# Patient Record
Sex: Female | Born: 1965 | Race: White | Hispanic: No | Smoking: Current every day smoker
Health system: Southern US, Community
[De-identification: ages and names within clinical notes are randomized; demographics above are authoritative.]

## PROBLEM LIST (undated history)

## (undated) DIAGNOSIS — F32A Depression, unspecified: Secondary | ICD-10-CM

## (undated) DIAGNOSIS — T7840XA Allergy, unspecified, initial encounter: Secondary | ICD-10-CM

## (undated) DIAGNOSIS — M1990 Unspecified osteoarthritis, unspecified site: Secondary | ICD-10-CM

## (undated) DIAGNOSIS — F419 Anxiety disorder, unspecified: Secondary | ICD-10-CM

## (undated) DIAGNOSIS — F329 Major depressive disorder, single episode, unspecified: Secondary | ICD-10-CM

## (undated) HISTORY — DX: Allergy, unspecified, initial encounter: T78.40XA

## (undated) HISTORY — PX: CARPAL TUNNEL RELEASE: SHX101

---

## 1997-09-25 ENCOUNTER — Ambulatory Visit (HOSPITAL_COMMUNITY): Admission: RE | Admit: 1997-09-25 | Discharge: 1997-09-25 | Payer: Self-pay | Admitting: Obstetrics and Gynecology

## 1997-10-17 ENCOUNTER — Other Ambulatory Visit: Admission: RE | Admit: 1997-10-17 | Discharge: 1997-10-17 | Payer: Self-pay | Admitting: Obstetrics and Gynecology

## 1999-06-09 ENCOUNTER — Other Ambulatory Visit: Admission: RE | Admit: 1999-06-09 | Discharge: 1999-06-09 | Payer: Self-pay | Admitting: Obstetrics and Gynecology

## 2000-06-05 ENCOUNTER — Encounter: Admission: RE | Admit: 2000-06-05 | Discharge: 2000-06-05 | Payer: Self-pay | Admitting: *Deleted

## 2000-07-05 ENCOUNTER — Other Ambulatory Visit: Admission: RE | Admit: 2000-07-05 | Discharge: 2000-07-05 | Payer: Self-pay | Admitting: Obstetrics and Gynecology

## 2000-10-12 ENCOUNTER — Encounter (INDEPENDENT_AMBULATORY_CARE_PROVIDER_SITE_OTHER): Payer: Self-pay | Admitting: Specialist

## 2000-10-12 ENCOUNTER — Ambulatory Visit (HOSPITAL_COMMUNITY): Admission: RE | Admit: 2000-10-12 | Discharge: 2000-10-12 | Payer: Self-pay | Admitting: Obstetrics and Gynecology

## 2003-08-18 ENCOUNTER — Ambulatory Visit (HOSPITAL_COMMUNITY): Admission: RE | Admit: 2003-08-18 | Discharge: 2003-08-18 | Payer: Self-pay | Admitting: Podiatry

## 2004-01-09 ENCOUNTER — Other Ambulatory Visit: Admission: RE | Admit: 2004-01-09 | Discharge: 2004-01-09 | Payer: Self-pay | Admitting: Obstetrics and Gynecology

## 2004-01-13 ENCOUNTER — Encounter: Admission: RE | Admit: 2004-01-13 | Discharge: 2004-01-13 | Payer: Self-pay | Admitting: Obstetrics and Gynecology

## 2004-04-10 ENCOUNTER — Emergency Department (HOSPITAL_COMMUNITY): Admission: EM | Admit: 2004-04-10 | Discharge: 2004-04-10 | Payer: Self-pay | Admitting: Family Medicine

## 2005-02-03 ENCOUNTER — Other Ambulatory Visit: Admission: RE | Admit: 2005-02-03 | Discharge: 2005-02-03 | Payer: Self-pay | Admitting: Obstetrics and Gynecology

## 2006-01-24 ENCOUNTER — Emergency Department: Payer: Self-pay | Admitting: Emergency Medicine

## 2007-10-04 ENCOUNTER — Ambulatory Visit: Payer: Self-pay | Admitting: Unknown Physician Specialty

## 2008-06-18 ENCOUNTER — Ambulatory Visit (HOSPITAL_BASED_OUTPATIENT_CLINIC_OR_DEPARTMENT_OTHER): Admission: RE | Admit: 2008-06-18 | Discharge: 2008-06-18 | Payer: Self-pay | Admitting: Urology

## 2008-06-18 ENCOUNTER — Encounter (INDEPENDENT_AMBULATORY_CARE_PROVIDER_SITE_OTHER): Payer: Self-pay | Admitting: Urology

## 2008-12-23 ENCOUNTER — Emergency Department (HOSPITAL_COMMUNITY): Admission: EM | Admit: 2008-12-23 | Discharge: 2008-12-23 | Payer: Self-pay | Admitting: Emergency Medicine

## 2008-12-28 ENCOUNTER — Emergency Department (HOSPITAL_COMMUNITY): Admission: EM | Admit: 2008-12-28 | Discharge: 2008-12-28 | Payer: Self-pay | Admitting: Family Medicine

## 2009-04-27 ENCOUNTER — Ambulatory Visit (HOSPITAL_COMMUNITY): Admission: RE | Admit: 2009-04-27 | Discharge: 2009-04-27 | Payer: Self-pay | Admitting: Orthopedic Surgery

## 2009-05-21 ENCOUNTER — Ambulatory Visit (HOSPITAL_BASED_OUTPATIENT_CLINIC_OR_DEPARTMENT_OTHER): Admission: RE | Admit: 2009-05-21 | Discharge: 2009-05-21 | Payer: Self-pay | Admitting: Orthopedic Surgery

## 2009-11-30 ENCOUNTER — Ambulatory Visit: Payer: Self-pay | Admitting: Unknown Physician Specialty

## 2010-05-11 LAB — POCT HEMOGLOBIN-HEMACUE: Hemoglobin: 14.5 g/dL (ref 12.0–15.0)

## 2010-06-15 NOTE — Op Note (Signed)
Shirley Grant, Shirley Grant              ACCOUNT NO.:  [id number]   MEDICAL RECORD NO.:  [id number]          PATIENT TYPE:  AMB   LOCATION:  [facility]                         FACILITY:  [facility]   PHYSICIAN:  Maretta Bees. Vonita Moss, M.D.DATE OF BIRTH:  Dec 22, 1965   DATE OF PROCEDURE:  DATE OF DISCHARGE:                               OPERATIVE REPORT   PREOPERATIVE DIAGNOSIS:  Interstitial cystitis.   POSTOPERATIVE DIAGNOSIS:  Interstitial cystitis.   PROCEDURE:  Cystoscopy, hydraulic overdistention of bladder and cold cup  bladder biopsy.   SURGEON:  Maretta Bees. Vonita Moss, M.D.   ANESTHESIA:  General.   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  She did not respond very well to  VESIcare.  Her pelvic pain and symptom score was 19.  She was advised  about possibility of interstitial cystitis and the workup of the risk of  bladder pain, hemorrhage, her small risk of bladder tear from the  procedure.   PROCEDURE:  The patient was brought to the operating room and placed in  lithotomy position.  The external genitalia were prepped and draped in  the usual fashion.  She was cystoscoped and bladder was unremarkable.  She was filled to 800 mL of water with 36 inches of water pressure.  Looking back in she did have scattered submucosal petechiae and a couple  of areas of hemorrhage.  The changes were not florid, but consistent  with interstitial cystitis.  Cold cup bladder biopsies were taken across  the bladder base and dome in three areas that were hemorrhagic.  The  biopsy sites were fulgurated with the Bugbee electrode.  The bladder was  emptied and the scope removed.  The patient was sent to the recovery  room in good condition.  Blood loss was insignificant.  She was given a  B and O suppository.      Maretta Bees. Vonita Moss, M.D.  Electronically Signed     LJP/MEDQ  D:  06/18/2008  T:  06/18/2008  Job:  [id number]

## 2010-06-18 NOTE — Op Note (Signed)
[facility]  Patient:    Shirley Grant, Shirley Grant Visit Number: [id number] MRN: [id number]          Service Type: Attending:  Rande Brunt. Eda Paschal, M.D. Dictated by:   Rande Brunt. Eda Paschal, M.D. Proc. Date: 10/12/00                             Operative Report  Dictation was done on the day of surgery but cannot be located and, therefore, is being redictated.    PREOPERATIVE DIAGNOSIS:       Dysfunctional uterine bleeding with probable endometrial polyp.  POSTOPERATIVE DIAGNOSIS:      Dysfunctional uterine bleeding with endometrial polyp.  OPERATION:                    Hysteroscopy, dilatation and curettage.  SURGEON:                      Daniel L. Eda Paschal, M.D.  ANESTHESIA:                   General.  INDICATIONS:                  The patient is a 45 year old, gravida 4, para 2, AB 2, with persistent menometrorrhagia.  She has been treated for several months with oral contraceptives without resolution of the above.  On ultrasound she has a question of an intrauterine lesion and she now enters the hospital for [facility], hysteroscopy because of persistent dysfunctional uterine bleeding, nonresponsive to oral contraceptives.  FINDINGS:                     External and vaginal within normal limits. Cervix is clean.  Uterus is anteverted, normal and size and shape.  Adnexa are palpably normal.  At the time of hysteroscopy, coming off the posterior wall of the uterus near the lower uterine segment is an approximately 1 cm growth consistent with a small endometrial polyp.  Other than this, top of the fundus, tubal ostia, anterior and posterior walls of the fundus, lower uterine segment and then the cervical canal were free of any disease.  DESCRIPTION OF PROCEDURE:     After adequate general anesthesia, the patient was placed in the dorsal lithotomy position, prepped and draped in the usual sterile manner.  A single-tooth tenaculum was placed in the  anterior lip of the cervix.  The cervix was dilated to a #35 Pratt dilator and then hysteroscopic examination was done using the hysteroscopic resectoscope using 3% sorbitol to expand the intrauterine cavity and using the camera for magnification.  The above findings were noted and 90-degree wire loupe with settings at 70 coag, 110 cutting, blend 1 was utilized and the polyp was excised.  A sharp curettage was then performed to be sure that adequate endometrial sampling was done.  The patient was rehysteroscoped after this and there were no further intrauterine lesions present.  Tissue was sent to pathology for tissue diagnosis.  The patient tolerated the procedure well. Estimated blood loss was less than 50 cc with none replaced.  Fluid deficit was less than 50 cc as well.  The patient left the operating room in satisfactory condition.  DESCRIPTION OF PROCEDURE: Dictated by:   Rande Brunt. Eda Paschal, M.D. Attending:  Rande Brunt. Eda Paschal, M.D. DD:  10/26/00 TD:  10/26/00 Job: [id number]

## 2010-08-29 ENCOUNTER — Inpatient Hospital Stay (INDEPENDENT_AMBULATORY_CARE_PROVIDER_SITE_OTHER)
Admission: RE | Admit: 2010-08-29 | Discharge: 2010-08-29 | Disposition: A | Discharge status: Home / Self Care | Payer: 59 | Source: Ambulatory Visit | Attending: Family Medicine | Admitting: Family Medicine

## 2010-08-29 DIAGNOSIS — M799 Soft tissue disorder, unspecified: Secondary | ICD-10-CM

## 2010-09-08 ENCOUNTER — Other Ambulatory Visit (HOSPITAL_COMMUNITY): Payer: Self-pay | Admitting: Orthopedic Surgery

## 2010-09-08 DIAGNOSIS — M545 Low back pain: Secondary | ICD-10-CM

## 2010-09-15 ENCOUNTER — Encounter (HOSPITAL_COMMUNITY)
Admission: RE | Admit: 2010-09-15 | Discharge: 2010-09-15 | Disposition: A | Discharge status: Home / Self Care | Payer: 59 | Source: Ambulatory Visit | Attending: Orthopedic Surgery | Admitting: Orthopedic Surgery

## 2010-09-15 ENCOUNTER — Ambulatory Visit (HOSPITAL_COMMUNITY)
Admission: RE | Admit: 2010-09-15 | Discharge: 2010-09-15 | Disposition: A | Discharge status: Home / Self Care | Payer: 59 | Source: Ambulatory Visit | Attending: Orthopedic Surgery | Admitting: Orthopedic Surgery

## 2010-09-15 ENCOUNTER — Encounter (HOSPITAL_COMMUNITY): Payer: Self-pay

## 2010-09-15 DIAGNOSIS — M545 Low back pain, unspecified: Secondary | ICD-10-CM | POA: Insufficient documentation

## 2010-09-15 DIAGNOSIS — Y9319 Activity, other involving water and watercraft: Secondary | ICD-10-CM | POA: Insufficient documentation

## 2010-09-15 DIAGNOSIS — IMO0002 Reserved for concepts with insufficient information to code with codable children: Secondary | ICD-10-CM | POA: Insufficient documentation

## 2010-09-15 DIAGNOSIS — W1692XA Jumping or diving into unspecified water causing other injury, initial encounter: Secondary | ICD-10-CM | POA: Insufficient documentation

## 2010-09-15 MED ORDER — TECHNETIUM TC 99M MEDRONATE IV KIT
24.2000 | PACK | Freq: Once | INTRAVENOUS | Status: AC | PRN
  Administered 2010-09-15: 24.2 via INTRAVENOUS

## 2011-10-07 LAB — COMPREHENSIVE METABOLIC PANEL
Alkaline Phosphatase: 98 U/L (ref 50–136)
Bilirubin,Total: 0.1 mg/dL — ABNORMAL LOW (ref 0.2–1.0)
Calcium, Total: 8.2 mg/dL — ABNORMAL LOW (ref 8.5–10.1)
Chloride: 109 mmol/L — ABNORMAL HIGH (ref 98–107)
Co2: 22 mmol/L (ref 21–32)
EGFR (African American): >60
EGFR (Non-African Amer.): >60
Osmolality: 280 (ref 275–301)
Potassium: 3.5 mmol/L (ref 3.5–5.1)
Sodium: 141 mmol/L (ref 136–145)
Total Protein: 6.6 g/dL (ref 6.4–8.2)

## 2011-10-07 LAB — ETHANOL
Ethanol %: 0.246 % — ABNORMAL HIGH (ref 0.000–0.080)
Ethanol: 246 mg/dL

## 2011-10-07 LAB — CBC
HCT: 40.4 % (ref 35.0–47.0)
HGB: 14.1 g/dL (ref 12.0–16.0)
MCH: 33.2 pg (ref 26.0–34.0)
MCHC: 34.9 g/dL (ref 32.0–36.0)
MCV: 95 fL (ref 80–100)
Platelet: 268 10*3/uL (ref 150–440)
RDW: 13.3 % (ref 11.5–14.5)

## 2011-10-07 LAB — TSH: Thyroid Stimulating Horm: 1.77 u[IU]/mL

## 2011-10-08 ENCOUNTER — Inpatient Hospital Stay: Payer: Self-pay | Admitting: Psychiatry

## 2011-10-08 LAB — URINALYSIS, COMPLETE
Leukocyte Esterase: NEGATIVE
Ph: 6 (ref 4.5–8.0)
Protein: NEGATIVE
RBC,UR: NONE SEEN /HPF (ref 0–5)

## 2011-10-08 LAB — DRUG SCREEN, URINE
Amphetamines, Ur Screen: NEGATIVE (ref ?–1000)
Benzodiazepine, Ur Scrn: POSITIVE (ref ?–200)
MDMA (Ecstasy)Ur Screen: NEGATIVE (ref ?–500)
Opiate, Ur Screen: NEGATIVE (ref ?–300)
Phencyclidine (PCP) Ur S: NEGATIVE (ref ?–25)

## 2011-10-08 LAB — PREGNANCY, URINE: Pregnancy Test, Urine: NEGATIVE m[IU]/mL

## 2011-10-08 LAB — ACETAMINOPHEN LEVEL: Acetaminophen: <2 ug/mL

## 2011-10-10 LAB — FOLATE: Folic Acid: 19.6 ng/mL (ref 3.1–100.0)

## 2012-06-10 ENCOUNTER — Emergency Department: Payer: Self-pay | Admitting: Emergency Medicine

## 2013-06-04 ENCOUNTER — Emergency Department: Payer: Self-pay | Admitting: Emergency Medicine

## 2013-06-05 LAB — URINALYSIS, COMPLETE
Bilirubin,UR: NEGATIVE
GLUCOSE, UR: NEGATIVE mg/dL (ref 0–75)
KETONE: NEGATIVE
LEUKOCYTE ESTERASE: NEGATIVE
NITRITE: NEGATIVE
PH: 5 (ref 4.5–8.0)
PROTEIN: NEGATIVE
RBC,UR: <1 /HPF (ref 0–5)
SPECIFIC GRAVITY: 1.005 (ref 1.003–1.030)

## 2013-06-05 LAB — ETHANOL
Ethanol %: 0.096 % — ABNORMAL HIGH
Ethanol: 96 mg/dL

## 2013-06-05 LAB — DRUG SCREEN, URINE
AMPHETAMINES, UR SCREEN: NEGATIVE (ref ?–1000)
Barbiturates, Ur Screen: NEGATIVE (ref ?–200)
Benzodiazepine, Ur Scrn: POSITIVE (ref ?–200)
Cannabinoid 50 Ng, Ur ~~LOC~~: NEGATIVE (ref ?–50)
Cocaine Metabolite,Ur ~~LOC~~: NEGATIVE (ref ?–300)
MDMA (Ecstasy)Ur Screen: NEGATIVE (ref ?–500)
METHADONE, UR SCREEN: NEGATIVE (ref ?–300)
Opiate, Ur Screen: POSITIVE (ref ?–300)
PHENCYCLIDINE (PCP) UR S: NEGATIVE (ref ?–25)
Tricyclic, Ur Screen: NEGATIVE (ref ?–1000)

## 2013-06-05 LAB — COMPREHENSIVE METABOLIC PANEL WITH GFR
Albumin: 3.5 g/dL
Alkaline Phosphatase: 49 U/L
Anion Gap: 6 — ABNORMAL LOW
BUN: 9 mg/dL
Bilirubin,Total: 0.2 mg/dL
Calcium, Total: 8.6 mg/dL
Chloride: 104 mmol/L
Co2: 30 mmol/L
Creatinine: 0.82 mg/dL
EGFR (African American): >60
EGFR (Non-African Amer.): >60
Glucose: 92 mg/dL
Osmolality: 278
Potassium: 3.6 mmol/L
SGOT(AST): 19 U/L
SGPT (ALT): 16 U/L
Sodium: 140 mmol/L
Total Protein: 6.1 g/dL — ABNORMAL LOW

## 2013-06-05 LAB — CBC
HCT: 38.6 %
HGB: 13.2 g/dL
MCH: 31.8 pg
MCHC: 34.2 g/dL
MCV: 93 fL
Platelet: 226 x10 3/mm 3
RBC: 4.16 X10 6/mm 3
RDW: 13.7 %
WBC: 10.1 x10 3/mm 3

## 2013-06-05 LAB — ACETAMINOPHEN LEVEL: Acetaminophen: 7 ug/mL — ABNORMAL LOW

## 2013-06-05 LAB — PREGNANCY, URINE: Pregnancy Test, Urine: NEGATIVE m[IU]/mL

## 2013-06-05 LAB — SALICYLATE LEVEL: Salicylates, Serum: 2.6 mg/dL

## 2014-05-20 NOTE — Consult Note (Signed)
Psychological Assessment  Shirley Grant Evaluation: 9-12-13Administered: [facility] Multiphasic Personality Inventory-2 (MMPI-2) for Referral: Ms. Shirley Grant was referred for a psychological assessment by her physician, Caryn SectionAarti Kapur, MD.  [facility] after overdosing on xanax and alcohol. She also reports abuse of alcohol on a daily basis. Please see the history and physical and psychosocial history for further background information. An assessment of personality structure was requested. Shirley Grant?s MMPI-2 protocol is compared to that of other adult females she obtained the following profile: [id number]. The MMPI-2 validity scales indicate that the clinical profile is not valid. The significant elevation on the positive malingering scales suggests a conscious attempt to present herself in a positive light and minimize any difficulties she may be experiencing. Impression:Good" profile   Electronic Signatures: Shirley Grant, Shirley Grant (PsyD, HSP-P)  (Signed on 12-Sep-13 15:17)  Authored  Last Updated: 12-Sep-13 15:17 by Shirley Frostoush, Shirley Grant (PsyD, HSP-P)

## 2014-05-20 NOTE — H&P (Signed)
PATIENT NAME:  Shirley GuarneriOAH, Shirley Grant MR#:  [id number] DATE OF BIRTH:  1965-03-25  DATE OF ADMISSION:  10/08/2011  IDENTIFYING INFORMATION: The patient is a 49 year old white female not employed since she lost her job a few days ago at [employer] after having worked two weeks and she lost her job because of a DWI. The patient has been married for eight years for the second time. She lives with her husband. They have two children that are 5516 and 49 years old. The patient comes for her first inpatient hospitalization on Psychiatry at [facility] with chief complaint "I overdosed on my Xanax and I took in too much alcohol and I drink every day and I need help".   HISTORY OF PRESENT ILLNESS: The patient reports she drinks beer at a rate of 8 to 12 cans per day and on top of it she overdosed on Xanax which are prescription pills and she does not know how many she took and so she was brought here for help. According to information obtained from the chart, she is on IVC that states that she has history of depression and she overdosed on Xanax this evening. The patient agrees to the statement and she reports that she has been depressed for many years. She has been getting her Xanax from Dr. Excell SeltzerBaker and she gets Xanax 1 mg p.o. three times a day and she has been getting this medication since age 49 years.   PAST PSYCHIATRIC HISTORY: No history of inpatient hospitalization on Psychiatry. No history of suicide attempts. She is being followed by Dr. Excell SeltzerBaker.   FAMILY HISTORY OF MENTAL ILLNESS: None known for mental illness. No history of suicides in the family.   FAMILY HISTORY: Raised by parents. Father is an Art gallery managerengineer. Father is living and 49 years old. Mother worked for [employer]. Mother is retired. She is living and 49 years old. Has three siblings. Close to family.   PERSONAL HISTORY: Born [place] Graduated from high school. Has an RN degree.   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 This job ended as they sold the business. 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   MILITARY HISTORY: None.  MARRIAGES: Married twice. First marriage lasted for four years. Cause of divorce "can't get along.". Has two children from first marriage ages 4716 and 49 years old. Second marriage is the current one and has been married for eight years. He owns a golf course and she calls him a "mean man". The patient has been having lots of conflicts with the 49 year old.   ALCOHOL AND DRUGS: First drink of alcohol was at age 89 years, became a problem during her second marriage because of problems with marriage. Admits that currently she has been drinking at a rate of 8 to 12 beers a day until she is drunk. Had one DWI when she lost her job. Never arrested for public drunkenness. Does admit to blackouts and tremors but no seizures and no DTs. Denies any other street or prescription drug abuse. Denies using IV drugs. Does admit smoking nicotine cigarettes at a rate of 1 to 2 packs a day for many years.   MEDICAL HISTORY: No known high blood pressure. No known diabetes. No major surgeries. No major injuries. No history of motor vehicle accident. Never been unconscious.   ALLERGIES: Allergic to  codeine.  PRIMARY CARE PHYSICIAN: She is being followed by Dr. Vear Clock at [facility]. Last appointment was sometime ago, cannot remember. Next appointment is to be made.   PHYSICAL EXAMINATION:  VITAL SIGNS: Temperature 97.2, pulse 71 per minute and regular, respirations 20 per minute and regular, blood pressure 130/80 mmHg.   HEENT: Head is normocephalic and atraumatic. Pupils equal, round, and reactive to light and accommodation. Fundi bilaterally benign. EOMS visualized. Tympanic membranes no exudate.   NECK: Supple without any organomegaly, lymphadenopathy, or  thyromegaly.  CHEST: Normal expansion. Normal breath sounds heard.   HEART: Normal S1, S2 without any murmurs or gallops.  ABDOMEN: Soft. No organomegaly. Bowel sounds heard.   RECTAL/PELVIC: Deferred.   SKIN: Normal turgor. No rash. Warm and dry. Intact.   EXTREMITIES: Nontender. Normal range of motion. No signs of injury. No pedal edema. No cyanosis. No clubbing.   NEUROLOGIC: Gait is normal. Romberg is negative. Cranial nerves II through XII are intact. Deep tendon reflexes 2+. Plantars are normal response.   MENTAL STATUS EXAMINATION: According to information obtained from the chart, the patient had slurred speech at the time of admission but currently speech is clear though it is slow. The patient appears to be drowsy, probably coming out of alcohol and from Xanax. She knew date, time, place, person, season, and situation. She knew the capital of [address], capital of [place], the name of the current president. Does admit feeling depressed. Denies feeling hopeless or helpless. Denies feeling worthless or useless. Denies suicidal or homicidal ideas or plans and said "not now". Probably she did have suicidal thoughts when she tried to overdose herself on an unknown number of Xanax pills. No evidence of psychosis. Denies auditory or visual hallucinations. Denies hearing voices or seeing things. Denies paranoid or suspicious ideas. Denies thought insertion or thought control. Denies having any grandiose ideas. She could spell the word world forward and backward though she was slow in answering. She could count money and recall and memory are good. Abstraction interpretations are good. Does admit to sleep and appetite disturbance since she has been feeling depressed and having marital conflicts. Insight and judgment guarded.  IMPRESSION: AXIS I: 1. Alcohol dependence, chronic, continuous with intoxication. 2. Prescription drug abuse with Xanax abuse and overdose on the  same. 3. Nicotine dependence.  4. Conflicts with family members, that is husband and with son.   AXIS II: Deferred.   AXIS III: None major.  AXIS IV: Severe. 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  AXIS V: GAF 25.   PLAN: The patient is admitted to [facility] for close observation, evaluation, and help. She will be started on detox protocol and will be started on medications to slowly withdraw her from benzodiazepines. She will be given antidepressant medications and medications to help her rest. During the stay in the hospital she will be given milieu therapy and supportive counseling and substance abuse counseling will be added. At the time of discharge she will not be depressed, will have enough information about substance abuse and problems related to the same. Appropriate follow-up appointments will be made and substance abuse referrals will be made at the time of discharge.   ____________________________ Jannet Mantis. Guss Bunde, MD skc:drc D: 10/08/2011 18:56:12 ET T: 10/09/2011 07:43:46 ET JOB#: [id number]  cc: Monika Salk K. Guss Bunde, MD, <Dictator> Beau Fanny MD ELECTRONICALLY SIGNED 10/09/2011 17:14

## 2014-05-20 NOTE — Consult Note (Signed)
  Consult Patient seen as requested of Dr. Maryruth BunKapur, MD in order to explore CD-IOP admission at this discharge from this Beh Med inpatient admission. Patient admitted to abuse of Alcoholic beverage as well as abuse of Rx meds. She was able to detail events that required the need for treatment in the Behavioral Medicine unit and was able to give evidence of the need for CD-IOP. She reported loss of employment due to recent DWI, Martial distress with pending separation, and the need to develop coping skills. She indicated that since she was no longer employed she would be unable to participate in the CD-IOP due to her limited finances.  She was oriented to the CD-IOP. Social Worker informed of outcome of assessment.   Electronic Signatures: Huel Cotehomas, Richard (PsyD)  (Signed on 09-Sep-13 15:03)  Authored  Last Updated: 09-Sep-13 15:03 by Huel Cotehomas, Richard (PsyD)

## 2014-05-24 NOTE — Consult Note (Signed)
PATIENT NAME:  Shirley GuarneriOAH, Conleigh MR#:  [id number] DATE OF BIRTH:  1965/09/11  DATE OF CONSULTATION:  06/05/2013  REFERRING PHYSICIAN:   CONSULTING PHYSICIAN:  Audery AmelJohn T. Clapacs, MD  IDENTIFYING INFORMATION AND REASON FOR CONSULT: A 49 year old woman with a history of alcohol dependence presented to the hospital with a possible suicide attempt. Consultation for appropriate disposition.   HISTORY OF PRESENT ILLNESS: Information obtained from the patient and the chart. The patient was brought to the Emergency Room after she texted a friend that she was taking pills with the implication that she was trying to kill herself. The friend called 911. The patient admits to me today that she took several Xanax and several Voltaren along with drinking. She claims now that she had no suicidal intent at all and that the person misunderstood her. She said that her mood has been feeling down, but that is chronic for her. She denies any suicidal ideation.  She says that she drinks alcohol and binges on it about once a week.  She is ambivalent about whether it is a problem. She is prescribed Xanax and claims that she does not abuse it. After pressing her, I got her to admit that she takes narcotic pain medicines as well as having occasional prescriptions of Vicodin. The patient says that she sleeps fine but that is because she takes her sedating medicine at night. Appetite is fine. Mood a little bit down. Denies psychotic symptoms. I spoke to her mother on the phone who describes the patient as being an alcoholic who is chronically out of control. Mother did not report any concern about suicidal behavior but did report long-standing problems with substance abuse.   PAST PSYCHIATRIC HISTORY: The patient has been to our hospital before. It has been a couple of years ago. At that time, she was drinking more heavily and went through detoxification. She was sent to the alcohol and drug abuse treatment center under involuntary  commitment. When I mentioned the experience to her today, she reacted with horror and begged me not to send her to the alcohol and drug abuse treatment center anymore. She has not been to any other rehabilitation since then. She denies that she is taking any medication, and currently for treatment of depression said that she was taking Lexapro but that it does not work anymore. She thinks that she might have taken medicine in the past that helped, but then says that she cannot remember what it was. Denies suicide attempts.   SUBSTANCE ABUSE HISTORY: Mother says that the patient has had a problem with drinking since she was a teenager. She has been to some rehabilitation programs, but has largely resisted facing up to it or getting sober. Also seems to abuse pills at times.   FAMILY HISTORY:  None known.  SOCIAL HISTORY: The patient is separated. Has an adult daughter. The patient says she is working part-time as a Engineer, civil (consulting)nurse currently. She sometimes lives with her mother and father, sometimes with her adult daughter. Seems to have difficulty getting along with people.   PAST MEDICAL HISTORY: Says she has chronic pain from bursitis in her right hip which is why she takes the Voltaren and Vicodin.   CURRENT MEDICATIONS: Xanax 1 mg 3 times a day, possibly Lexapro 20 mg a day when she takes it.   ALLERGIES: No known drug allergies.   REVIEW OF SYSTEMS: Pain in the right hip moderate. Fatigue. Anxiety. Denies suicidal ideation. Denies homicidal ideation. Denies any psychotic symptoms. The rest  of the review of systems is negative.   MENTAL STATUS EXAMINATION:  Disheveled woman, looks her stated age or older. Passively cooperative, somewhat defensive. Eye contact good. Psychomotor activity, a little fidgety. Speech normal rate, tone and volume. Affect anxious. Mood stated as being worried. Thoughts are lucid. No evidence of loosening of associations. No evidence of delusions. Denies auditory or visual  hallucinations. Denies suicidal or homicidal ideation. Judgment and insight somewhat impaired. Intelligence normal. Alert and oriented x 4, short and long-term memory intact.   LABORATORY RESULTS: Urinalysis has bacteria in it, possible infection. Pregnancy test negative. Drug test positive for benzodiazepines, also for opiates. Alcohol level was 96 on admission. Chemistry normal. CBC normal.   ASSESSMENT: A 49 year old woman with a history of alcohol dependence presents having made suicidal statements when taking pills and drinking. Sobered up now, she absolutely denies any suicidal ideation. The patient is requesting discharge, minimizing her symptoms. She does not appear to be having serious withdrawal problems. She does appear to have poor insight and judgment about her long-term treatment.   TREATMENT PLAN: Given the time of night and the lack of resources, I am not going to discharge her at the moment. We will continue to observe her overnight in the Emergency Room. I offered the alcohol and drug abuse treatment center to her but she absolutely reacted with horror. We will rediscuss her substance abuse in the morning. If she continues to be physically stable and denying suicidal ideation, we will probably discharge her with outpatient referral in the community.   DIAGNOSIS, PRINCIPAL AND PRIMARY:  AXIS I:  Substance-induced mood disorder.   SECONDARY DIAGNOSES:  AXIS I:  1. Alcohol dependence.  2. Benzodiazepine abuse.  AXIS II: Deferred.  AXIS III: Chronic pain.  AXIS IV: Severe from effects of her substance abuse on her lifestyle.  AXIS V: Functioning at time of evaluation 40.   ____________________________ Audery Amel, MD jtc:dd D: 06/05/2013 22:07:41 ET T: 06/06/2013 00:55:15 ET JOB#: [id number]  cc: Audery Amel, MD, <Dictator> Audery Amel MD ELECTRONICALLY SIGNED 06/10/2013 0:42

## 2014-05-24 NOTE — Consult Note (Signed)
PATIENT NAME:  Shirley Grant, Shirley Grant MR#:  [id number] DATE OF BIRTH:  1965-09-02  DATE OF CONSULTATION:  06/06/2013  REFERRING PHYSICIAN:   CONSULTING PHYSICIAN:  Audery AmelJohn T. Clapacs, MD  HISTORY OF PRESENT ILLNESS: This is a 49 year old woman who presented to the Emergency Room after being brought in under involuntary commitment. She had been drinking and took some of her prescription medicine and allegedly had texted a friend statements that alleged suicidal ideation. Since being here in the hospital, she initially made some vaguely suicidal comments to nursing, but has consistently denied any suicidal ideation to me. She has sobered up from her intoxication and is not showing any acute signs of withdrawal. The patient does not appear to be psychotic. She is able to state positive plans for the future. On further evaluation she acknowledges that she has had a drinking problem for a long time and says that she plans to stop drinking. She was open to accepting information about [facility] and the intensive outpatient program.   REVIEW OF SYSTEMS: Denies suicidal or homicidal ideation. Says mood is improved. Not feeling sick to her stomach, not nauseous, not having any agitation. The rest of the review of systems is negative.   MENTAL STATUS EXAMINATION: Slightly disheveled woman who looks her stated age, cooperative with the interview. Eye contact good. Psychomotor activity, normal speech, normal rate, tone and volume. Affect slightly blunted but reactive. Mood stated as being okay. Thoughts are lucid and directed without loosening of associations or delusions. Denies auditory or visual hallucinations. Denies suicidal or homicidal ideation. Judgment and insight improving. Normal intelligence. Alert and oriented x4, short and long-term memory appear generally intact.   ASSESSMENT: A 49 year old woman with alcohol dependence confirmed by my independent contact with her mother. Was drinking and took some prescription pills  in an nonfatal overdose, but with possible suicidal statements. Now completely denies suicidal ideation and has positive plans for the future. Able to articulate an understanding that she ought to stop drinking and get into substance abuse treatment. The patient has declined our offer for referral to inpatient rehab. She has been educated about [facility] and their programs including the intensive outpatient program and states that she intends to go and follow up with them immediately. At this point, she no longer meets commitment criteria. I have discussed the case with the Emergency Room doctor and she can be released from the Emergency Room with [facility].   DIAGNOSIS, PRINCIPAL AND PRIMARY:  AXIS I: Substance-induced mood disorder, resolved.   SECONDARY DIAGNOSES: AXIS I:  1.  Alcohol dependence.  2.  Benzodiazepine abuse and opiate abuse.  AXIS II: Deferred.  AXIS III: No diagnosis.  AXIS IV: Moderate to severe from having an unstable place to live, ongoing substance abuse.  AXIS V: Functioning at time of discharge 55.  ____________________________ Audery AmelJohn T. Clapacs, MD jtc:sb D: 06/06/2013 12:39:24 ET T: 06/06/2013 12:53:31 ET JOB#: [id number]  cc: Audery AmelJohn T. Clapacs, MD, <Dictator> Audery AmelJOHN T CLAPACS MD ELECTRONICALLY SIGNED 06/10/2013 0:42

## 2014-07-25 ENCOUNTER — Encounter: Payer: Self-pay | Admitting: *Deleted

## 2014-07-25 ENCOUNTER — Emergency Department
Admission: EM | Admit: 2014-07-25 | Discharge: 2014-07-25 | Disposition: A | Discharge status: Home / Self Care | Payer: No Typology Code available for payment source | Attending: Emergency Medicine | Admitting: Emergency Medicine

## 2014-07-25 ENCOUNTER — Emergency Department: Payer: No Typology Code available for payment source

## 2014-07-25 DIAGNOSIS — N3090 Cystitis, unspecified without hematuria: Secondary | ICD-10-CM | POA: Diagnosis not present

## 2014-07-25 DIAGNOSIS — R109 Unspecified abdominal pain: Secondary | ICD-10-CM

## 2014-07-25 DIAGNOSIS — Z3202 Encounter for pregnancy test, result negative: Secondary | ICD-10-CM | POA: Insufficient documentation

## 2014-07-25 DIAGNOSIS — Z720 Tobacco use: Secondary | ICD-10-CM | POA: Diagnosis not present

## 2014-07-25 HISTORY — DX: Anxiety disorder, unspecified: F41.9

## 2014-07-25 HISTORY — DX: Depression, unspecified: F32.A

## 2014-07-25 HISTORY — DX: Major depressive disorder, single episode, unspecified: F32.9

## 2014-07-25 LAB — COMPREHENSIVE METABOLIC PANEL
ALBUMIN: 3.6 g/dL (ref 3.5–5.0)
ALK PHOS: 60 U/L (ref 38–126)
ALT: 11 U/L — AB (ref 14–54)
ANION GAP: 4 — AB (ref 5–15)
AST: 16 U/L (ref 15–41)
BILIRUBIN TOTAL: 0.2 mg/dL — AB (ref 0.3–1.2)
BUN: 10 mg/dL (ref 6–20)
CHLORIDE: 105 mmol/L (ref 101–111)
CO2: 27 mmol/L (ref 22–32)
Calcium: 8.8 mg/dL — ABNORMAL LOW (ref 8.9–10.3)
Creatinine, Ser: 0.71 mg/dL (ref 0.44–1.00)
GFR calc Af Amer: >60 mL/min (ref >60)
GFR calc non Af Amer: >60 mL/min (ref >60)
Glucose, Bld: 90 mg/dL (ref 65–99)
POTASSIUM: 3.6 mmol/L (ref 3.5–5.1)
SODIUM: 136 mmol/L (ref 135–145)
TOTAL PROTEIN: 6.4 g/dL — AB (ref 6.5–8.1)

## 2014-07-25 LAB — CBC
HEMATOCRIT: 39.9 % (ref 35.0–47.0)
Hemoglobin: 13.2 g/dL (ref 12.0–16.0)
MCH: 31.3 pg (ref 26.0–34.0)
MCHC: 33.1 g/dL (ref 32.0–36.0)
MCV: 94.4 fL (ref 80.0–100.0)
PLATELETS: 202 10*3/uL (ref 150–440)
RBC: 4.23 MIL/uL (ref 3.80–5.20)
RDW: 13.4 % (ref 11.5–14.5)
WBC: 8.1 10*3/uL (ref 3.6–11.0)

## 2014-07-25 LAB — URINALYSIS COMPLETE WITH MICROSCOPIC (ARMC ONLY)
BILIRUBIN URINE: NEGATIVE
GLUCOSE, UA: NEGATIVE mg/dL
KETONES UR: NEGATIVE mg/dL
NITRITE: NEGATIVE
Protein, ur: NEGATIVE mg/dL
SPECIFIC GRAVITY, URINE: 1.006 (ref 1.005–1.030)
pH: 5 (ref 5.0–8.0)

## 2014-07-25 LAB — LIPASE, BLOOD: LIPASE: 29 U/L (ref 22–51)

## 2014-07-25 LAB — PREGNANCY, URINE: Preg Test, Ur: NEGATIVE

## 2014-07-25 MED ORDER — KETOROLAC TROMETHAMINE 30 MG/ML IJ SOLN
INTRAMUSCULAR | Status: AC
  Administered 2014-07-25: 30 mg
  Filled 2014-07-25: qty 1

## 2014-07-25 MED ORDER — SODIUM CHLORIDE 0.9 % IV SOLN
1000.0000 mL | Freq: Once | INTRAVENOUS | Status: AC
  Administered 2014-07-25: 1000 mL via INTRAVENOUS

## 2014-07-25 MED ORDER — NITROFURANTOIN MONOHYD MACRO 100 MG PO CAPS: 100.0000 mg | ORAL_CAPSULE | Freq: Two times a day (BID) | ORAL | Status: AC

## 2014-07-25 NOTE — ED Notes (Signed)
Pt states she was dx with kidney stones by PCP Dr.Phillips on Thurs and has had no pain relief, now has urinary symptoms. Pt c/o left flank and LLQ pain. Pt c/o freq with urination and pressure. Pt was given Percocet 10/325mg  taken last at midnight.

## 2014-07-25 NOTE — ED Notes (Signed)
Patient transported to CT 

## 2014-07-25 NOTE — ED Provider Notes (Signed)
[facility] Emergency Department Provider Note  ____________________________________________  Time seen: 7 AM  I have reviewed the triage vital signs and the nursing notes.   HISTORY  Chief Complaint Flank Pain     HPI Shirley Grant is a 49 y.o. female who presents with complaints of right sided back pain which she attributes to kidney stones. She reports she was diagnosed by her PCP with kidney stones although no images were done. She reports she developed pain in the left and right flanks simultaneously yesterday. The pain is sharp and "excruciating". She reports her physician gave her Percocet 10 mg but those are not doing anything. She denies nausea vomiting. No fevers no chills. She has mild dysuria. No recent injury. No neuro deficits. She denies IV drug abuse.     Past Medical History  Diagnosis Date  . Anxiety   . Depression     There are no active problems to display for this patient.   Past Surgical History  Procedure Laterality Date  . Carpal tunnel release      No current outpatient prescriptions on file.  Allergies Codeine  No family history on file.  Social History History  Substance Use Topics  . Smoking status: Current Every Day Smoker -- 1.00 packs/day for 32 years    Types: Cigarettes  . Smokeless tobacco: Not on file  . Alcohol Use: No    Review of Systems  Constitutional: Negative for fever. Eyes: Negative for visual changes. ENT: Negative for sore throat Cardiovascular: Negative for chest pain. Respiratory: Negative for shortness of breath. Gastrointestinal: Right flank pain Genitourinary: Mild dysuria Musculoskeletal: Right back pain Skin: Negative for rash. Neurological: Negative for headaches or focal weakness Psychiatric: No anxiety  10-point ROS otherwise negative.  ____________________________________________   PHYSICAL EXAM:  VITAL SIGNS: ED Triage Vitals  Enc Vitals Group     BP 07/25/14  0619 156/97 mmHg     Pulse Rate 07/25/14 0619 122     Resp 07/25/14 0619 20     Temp 07/25/14 0619 97.9 F (36.6 C)     Temp Source 07/25/14 0619 Oral     SpO2 07/25/14 0619 97 %     Weight 07/25/14 0619 169 lb (76.658 kg)     Height 07/25/14 0619 5\' 6"  (1.676 m)     Head Cir --      Peak Flow --      Pain Score 07/25/14 0620 8     Pain Loc --      Pain Edu? --      Excl. in GC? --      Constitutional: Alert and oriented. Well appearing and in no distress. Eyes: Conjunctivae are normal.  ENT   Head: Normocephalic and atraumatic.   Mouth/Throat: Mucous membranes are moist. Cardiovascular: Normal rate, regular rhythm. Normal and symmetric distal pulses are present in all extremities. No murmurs, rubs, or gallops. Respiratory: Normal respiratory effort without tachypnea nor retractions. Breath sounds are clear and equal bilaterally.  Gastrointestinal: Soft and non-tender in all quadrants. No distention. There is no CVA tenderness. Genitourinary: deferred Musculoskeletal: Nontender with normal range of motion in all extremities. No lower extremity tenderness nor edema. Neurologic:  Normal speech and language. No gross focal neurologic deficits are appreciated. Skin:  Skin is warm, dry and intact. No rash noted. Psychiatric: Mood and affect are normal. Patient exhibits appropriate insight and judgment.  ____________________________________________    LABS (pertinent positives/negatives)  Labs Reviewed  CBC  COMPREHENSIVE METABOLIC PANEL  URINALYSIS COMPLETEWITH MICROSCOPIC ([facility] ONLY)  LIPASE, BLOOD  PREGNANCY, URINE    ____________________________________________   EKG  None  ____________________________________________    RADIOLOGY  CT scan unremarkable  ____________________________________________   PROCEDURES  Procedure(s) performed: none  Critical Care performed: none  ____________________________________________   INITIAL IMPRESSION /  ASSESSMENT AND PLAN / ED COURSE  Pertinent labs & imaging results that were available during my care of the patient were reviewed by me and considered in my medical decision making (see chart for details).  Review of past medical records demonstrates patient has a history of benzodiazepine and opiate abuse as well as a history of depression which has led to her being involuntarily committed at our hospital before. Given this history and that I am not yet certain as to the cause of the patient's pain I offered her IV Toradol which she refused and stated it did not work for her. We will give her IV fluids check blood tests urinalysis possibly CAT scan.  ----------------------------------------- 9:22 AM on 07/25/2014 -----------------------------------------  CT scan negative. Possibly mild cystitis. We will treat with antibiotics. Patient has pain medications at home. Heart rate improved with fluids. She feels better and is anxious to leave and requests for the IV to be removed  ____________________________________________   FINAL CLINICAL IMPRESSION(S) / ED DIAGNOSES  Final diagnoses:  Flank pain  Cystitis     Jene Every, MD 07/25/14 [id number]

## 2014-07-25 NOTE — Discharge Instructions (Signed)

## 2015-09-01 ENCOUNTER — Encounter: Payer: Self-pay | Admitting: Podiatry

## 2015-09-01 ENCOUNTER — Ambulatory Visit (INDEPENDENT_AMBULATORY_CARE_PROVIDER_SITE_OTHER): Payer: Self-pay | Admitting: Podiatry

## 2015-09-01 ENCOUNTER — Ambulatory Visit (INDEPENDENT_AMBULATORY_CARE_PROVIDER_SITE_OTHER): Payer: Self-pay

## 2015-09-01 VITALS — BP 140/102 | HR 84 | Resp 18

## 2015-09-01 DIAGNOSIS — R52 Pain, unspecified: Secondary | ICD-10-CM

## 2015-09-01 DIAGNOSIS — M722 Plantar fascial fibromatosis: Secondary | ICD-10-CM

## 2015-09-01 NOTE — Patient Instructions (Signed)

## 2015-09-01 NOTE — Progress Notes (Signed)
   Subjective:    Patient ID: KIELA GOOKIN, female    DOB: 11/14/1965, 50 y.o.   MRN: [id number]  HPI  50 year old female presents the office today for concerns of 2 areas of soft tissue mass in the arch of her left foot which is been ongoing for about 6 months. She feels the areas have been growing in the area is painful with pressure and weightbearing. Denies stepping on any foreign objects. Denies any recent injury or trauma. No recent treatment for this. No other concerns today.  Review of Systems  All other systems reviewed and are negative.      Objective:   Physical Exam General: AAO x3, NAD  Dermatological: Skin is warm, dry and supple bilateral. Nails x 10 are well manicured; remaining integument appears unremarkable at this time. There are no open sores, no preulcerative lesions, no rash or signs of infection present.  Vascular: Dorsalis Pedis artery and Posterior Tibial artery pedal pulses are 2/4 bilateral with immedate capillary fill time. Pedal hair growth present.  There is no pain with calf compression, swelling, warmth, erythema.   Neruologic: Grossly intact via light touch bilateral. Vibratory intact via tuning fork bilateral. Protective threshold with Semmes Wienstein monofilament intact to all pedal sites bilateral.   Musculoskeletal: Two non-mobile soft tissue masses present in the medial band of the plantar fascial in the arch from the left side. There is tenderness palpation along this area. There is no overlying erythema or increase in warmth. It is no overlying skin breakdown. There is no other areas of tenderness to bilateral lower chemise.  Gait: Unassisted, Nonantalgic.       Assessment & Plan:  Left foot plantar fibromas 2 -Treatment options discussed including all alternatives, risks, and complications -Etiology of symptoms were discussed -X-rays were obtained and reviewed with the patient. 2 skin markers elected to have an area soft tissue mass. No  underlying bony destruction no evidence of foreign body. -I discussed steroid injection she wishes to proceed. Under sterile conditions mature Kenalog and local anesthetic was infiltrated into each of the soft tissue mass without couple complications. Post injection care was discussed. -Order compound cream to include verapamil -Offloading pads -Follow-up as scheduled or sooner if needed.  Ovid Curd, DPM

## 2015-09-06 DIAGNOSIS — M722 Plantar fascial fibromatosis: Secondary | ICD-10-CM | POA: Insufficient documentation

## 2015-09-22 ENCOUNTER — Encounter: Payer: Self-pay | Admitting: Podiatry

## 2015-09-22 ENCOUNTER — Ambulatory Visit (INDEPENDENT_AMBULATORY_CARE_PROVIDER_SITE_OTHER): Payer: Self-pay | Admitting: Podiatry

## 2015-09-22 DIAGNOSIS — M722 Plantar fascial fibromatosis: Secondary | ICD-10-CM

## 2015-09-22 NOTE — Progress Notes (Signed)
Subjective: 50 year old female presents the office today for Evaluation of plantar fibromatosis on the left foot. She states that the mass has decreased greatly in size and she can walk without pain but there is still the mass there. She did not get the verapamil cream.  Denies any systemic complaints such as fevers, chills, nausea, vomiting. No acute changes since last appointment, and no other complaints at this time.   Objective: AAO x3, NAD DP/PT pulses palpable bilaterally, CRT less than 3 seconds On the medial band of the plantar fascia is a non-mobile firm soft tissue mass which is sustained slight decrease in size in last appointment. Mild, but much improved, compared to last appointment. There is no overlying edema, erythema, or increase in warmth. No other mass identified.  No edema, erythema, increase in warmth to bilateral lower extremities.  No open lesions or pre-ulcerative lesions.  No pain with calf compression, swelling, warmth, erythema  Assessment: Resolving plantar fibromatosis left foot.  Plan: -All treatment options discussed with the patient including all alternatives, risks, complications.  -Due to the continued pain as well as the continued mass patient requested a second steroid injection. Under sterile conditions a mixture of Kenalog, local anesthetic was infiltrated into the mass without complications. Post injection care discussed. -Reordered compound cream to include verapamil -Stretching exercises -Follow-up in 4 weeks should symptoms continue or sooner if any problems arise. In the meantime, encouraged to call the office with any questions, concerns, change in symptoms.   Ovid CurdMatthew Wanya Bangura, DPM

## 2015-09-23 MED ORDER — NONFORMULARY OR COMPOUNDED ITEM: 6 refills | Status: DC

## 2015-09-23 NOTE — Addendum Note (Signed)
Addended by: Alphia Kava'CONNELL, Hester Joslin D on: 09/23/2015 10:45 AM   Modules accepted: Orders

## 2016-01-12 ENCOUNTER — Emergency Department
Admission: EM | Admit: 2016-01-12 | Discharge: 2016-01-12 | Disposition: A | Discharge status: Home / Self Care | Payer: No Typology Code available for payment source | Attending: Emergency Medicine | Admitting: Emergency Medicine

## 2016-01-12 ENCOUNTER — Encounter: Payer: Self-pay | Admitting: Emergency Medicine

## 2016-01-12 ENCOUNTER — Emergency Department: Payer: No Typology Code available for payment source

## 2016-01-12 DIAGNOSIS — W1839XA Other fall on same level, initial encounter: Secondary | ICD-10-CM | POA: Insufficient documentation

## 2016-01-12 DIAGNOSIS — S82892A Other fracture of left lower leg, initial encounter for closed fracture: Secondary | ICD-10-CM | POA: Insufficient documentation

## 2016-01-12 DIAGNOSIS — T148XXA Other injury of unspecified body region, initial encounter: Secondary | ICD-10-CM

## 2016-01-12 DIAGNOSIS — Y929 Unspecified place or not applicable: Secondary | ICD-10-CM | POA: Insufficient documentation

## 2016-01-12 DIAGNOSIS — Y999 Unspecified external cause status: Secondary | ICD-10-CM | POA: Insufficient documentation

## 2016-01-12 DIAGNOSIS — Z79899 Other long term (current) drug therapy: Secondary | ICD-10-CM | POA: Insufficient documentation

## 2016-01-12 DIAGNOSIS — F17210 Nicotine dependence, cigarettes, uncomplicated: Secondary | ICD-10-CM | POA: Insufficient documentation

## 2016-01-12 DIAGNOSIS — Y939 Activity, unspecified: Secondary | ICD-10-CM | POA: Insufficient documentation

## 2016-01-12 MED ORDER — MELOXICAM 15 MG PO TABS: 15.0000 mg | ORAL_TABLET | Freq: Every day | ORAL | 0 refills | Status: AC

## 2016-01-12 MED ORDER — KETOROLAC TROMETHAMINE 60 MG/2ML IM SOLN
60.0000 mg | Freq: Once | INTRAMUSCULAR | Status: AC
  Administered 2016-01-12: 60 mg via INTRAMUSCULAR
  Filled 2016-01-12: qty 2

## 2016-01-12 NOTE — ED Triage Notes (Signed)
Patient presents to ED via POV after a fall. Patient denies LOC or hitting head. Patient c/o left ankle pain. Deformity noted.

## 2016-01-12 NOTE — ED Provider Notes (Signed)
[facility] Emergency Department Provider Note  ____________________________________________  Time seen: Approximately 11:32 PM  I have reviewed the triage vital signs and the nursing notes.   HISTORY  Chief Complaint Ankle Pain    HPI Shirley Grant is a 50 y.o. female resents to the emergency department after falling and injuring her left ankle. Patient states that ankle is painful on the lateral side. Patient states that she can move her toes normally. Patient denies any additional injuries. Patient did not hit head.Patient has not taken any medications or has not used ice.   Past Medical History:  Diagnosis Date  . Anxiety   . Depression     Patient Active Problem List   Diagnosis Date Noted  . 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 CARPAL TUNNEL RELEASE      Prior to Admission medications   Medication Sig Start Date End Date Taking? Authorizing Provider  ALPRAZolam Prudy Feeler(XANAX) 1 MG tablet Take 1 mg by mouth at bedtime as needed for anxiety.    Historical Provider, MD  escitalopram (LEXAPRO) 20 MG tablet Take 20 mg by mouth daily.    Historical Provider, MD  meloxicam (MOBIC) 15 MG tablet Take 1 tablet (15 mg total) by mouth daily. 01/12/16 01/22/16  Enid DerryAshley Yoshiye Kraft, PA-C  NONFORMULARY OR COMPOUNDED ITEM [facility]:  Scar Cream - Verapamil 10%, Pentoxifylline 5%, apply 1-2 grams to affected area 3-4 times daily. 09/23/15   Vivi BarrackMatthew R Wagoner, DPM  zolpidem (AMBIEN) 10 MG tablet Take 10 mg by mouth at bedtime as needed for sleep.    Historical Provider, MD    Allergies Codeine  No family history on file.  Social History Social History  Substance Use Topics  . Smoking status: Current Every Day Smoker    Packs/day: 1.00    Years: 32.00    Types: Cigarettes  . Smokeless tobacco: Not on file  . Alcohol use No     Review of Systems  Constitutional: No  fever/chills Cardiovascular: no chest pain. Respiratory: no cough. No SOB. Gastrointestinal: No abdominal pain.  No nausea, no vomiting.  No diarrhea.  No constipation. Skin: Negative for rash, abrasions, lacerations, ecchymosis. Neurological: Negative for headaches, focal weakness or numbness.   ____________________________________________   PHYSICAL EXAM:  VITAL SIGNS: ED Triage Vitals  Enc Vitals Group     BP 01/12/16 2021 (!) 144/90     Pulse Rate 01/12/16 2021 96     Resp 01/12/16 2021 20     Temp 01/12/16 2021 98 F (36.7 C)     Temp Source 01/12/16 2021 Oral     SpO2 01/12/16 2021 94 %     Weight 01/12/16 2021 155 lb (70.3 kg)     Height 01/12/16 2021 5\' 7"  (1.702 m)     Head Circumference --      Peak Flow --      Pain Score 01/12/16 2017 8     Pain Loc --      Pain Edu? --      Excl. in GC? --      Constitutional: Alert and oriented. Well appearing and in no acute distress. Eyes: Conjunctivae are normal. PERRL. EOMI. Head: Atraumatic. ENT:      Ears:       Nose: No congestion/rhinnorhea.      Mouth/Throat: Mucous membranes are moist.  Neck: No stridor.  Cardiovascular: Normal rate, regular rhythm. Normal S1 and S2.  Good peripheral  circulation. Respiratory: Normal respiratory effort without tachypnea or retractions. Lungs CTAB. Good air entry to the bases with no decreased or absent breath sounds. Musculoskeletal: Limited range of motion of left ankle. Moderate swelling over lateral malleolus. Tenderness to palpation over lateral malleolus. Neurologic:  Normal speech and language. No gross focal neurologic deficits are appreciated.  Skin:  Skin is warm, dry and intact. No rash noted. Psychiatric: Mood and affect are normal. Speech and behavior are normal. Patient exhibits appropriate insight and judgement.   ____________________________________________   LABS (all labs ordered are listed, but only abnormal results are displayed)  Labs Reviewed - No data  to display ____________________________________________  EKG   ____________________________________________  RADIOLOGY Lexine BatonI, Shirley Grant, personally viewed and evaluated these images (plain radiographs) as part of my medical decision making, as well as reviewing the written report by the radiologist  Dg Ankle Complete Left  Result Date: 01/12/2016 CLINICAL DATA:  Pain and swelling across the left ankle EXAM: LEFT ANKLE COMPLETE - 3+ VIEW COMPARISON:  None. FINDINGS: Small calcaneal spur. Large amount of lateral soft tissue swelling. Suspect tiny avulsion injury off the lateral malleolus. Mortise symmetric. IMPRESSION: Large soft tissue swelling laterally with suspected tiny avulsion injury off the lateral fibular malleolus. Electronically Signed   By: Jasmine PangKim  Fujinaga M.D.   On: 01/12/2016 20:38    ____________________________________________    PROCEDURES  Procedure(s) performed:    Procedures    Medications  ketorolac (TORADOL) injection 60 mg (60 mg Intramuscular Given 01/12/16 2216)     ____________________________________________   INITIAL IMPRESSION / ASSESSMENT AND PLAN / ED COURSE  Pertinent labs & imaging results that were available during my care of the patient were reviewed by me and considered in my medical decision making (see chart for details).  Review of the [place] CSRS was performed in accordance of the [facility] prior to dispensing any controlled drugs.  Clinical Course     Patient's diagnosis is consistent with small avulsion fracture of left lateral malleolus. Patients foot was wrapped and was placed in boot and patient was given crutches. Patient will be discharged home with prescriptions for Meloxicam. Patient is to follow up with ortho as needed or otherwise directed. Patient is given ED precautions to return to the ED for any worsening or new symptoms.     ____________________________________________  FINAL CLINICAL IMPRESSION(S) / ED  DIAGNOSES  Final diagnoses:  Avulsion fracture      NEW MEDICATIONS STARTED DURING THIS VISIT:  Discharge Medication List as of 01/12/2016 10:00 PM    START taking these medications   Details  meloxicam (MOBIC) 15 MG tablet Take 1 tablet (15 mg total) by mouth daily., Starting Tue 01/12/2016, Until Fri 01/22/2016, Print            This chart was dictated using voice recognition software/Dragon. Despite best efforts to proofread, errors can occur which can change the meaning. Any change was purely unintentional.   Enid DerryAshley Elana Jian, PA-C 01/12/16 [id number]    Sharman CheekPhillip Stafford, MD 01/12/16 2352

## 2016-03-04 ENCOUNTER — Telehealth: Payer: Self-pay | Admitting: *Deleted

## 2016-03-04 NOTE — Telephone Encounter (Signed)
Pt left name, DOB and phone number. I spoke with pt and she states her foot hurts and she is a cash pay patient and can't get in until she can pay. I encouraged pt to use ice therapy.

## 2016-06-14 IMAGING — CT CT RENAL STONE PROTOCOL
1 of 2 series · 15 of 32 positions shown, 19 images · non-contrast
Comparison: None

CLINICAL DATA: LEFT flank and LEFT lower quadrant pain

EXAM:
CT ABDOMEN AND PELVIS WITHOUT CONTRAST
TECHNIQUE: Multidetector CT imaging of the abdomen and pelvis was performed
following the standard protocol without IV contrast. Sagittal and
coronal MPR images reconstructed from axial data set. Oral contrast
not administered for this indication.

[Series 2: stone standard full · axial · 0.73mm/px · z∈[-1076,-676]mm · 15 of 88 slices shown, 19 images]
[im 4/88  soft-tissue]
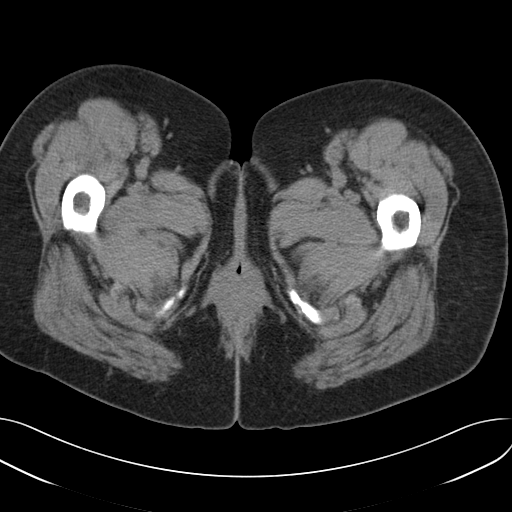
[im 4/88  bone]
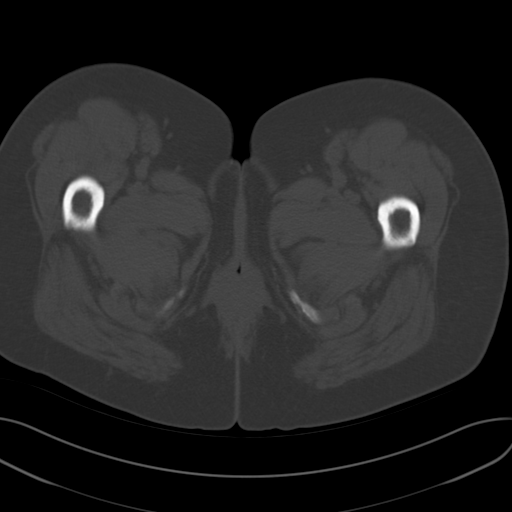
[im 11/88  soft-tissue]
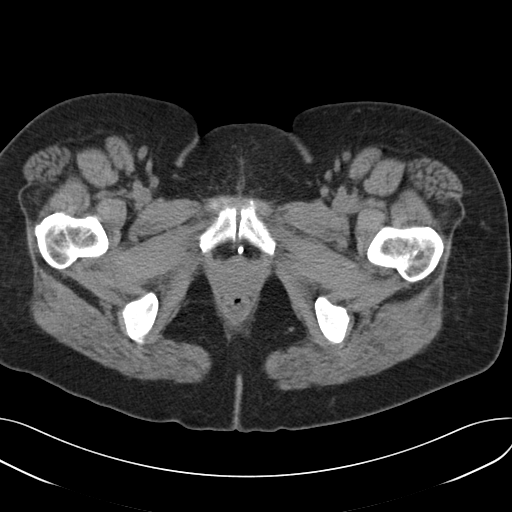
[im 19/88  soft-tissue]
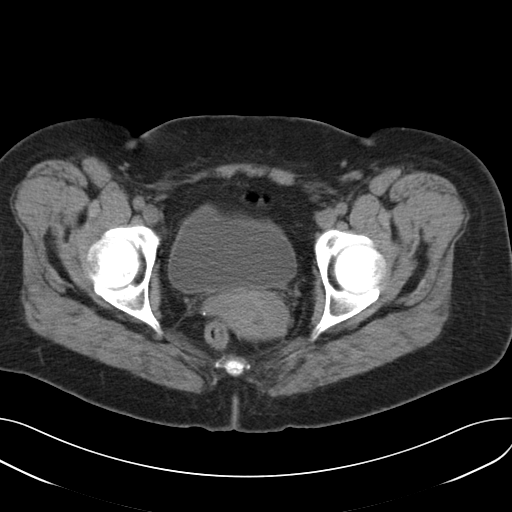
[im 26/88  soft-tissue]
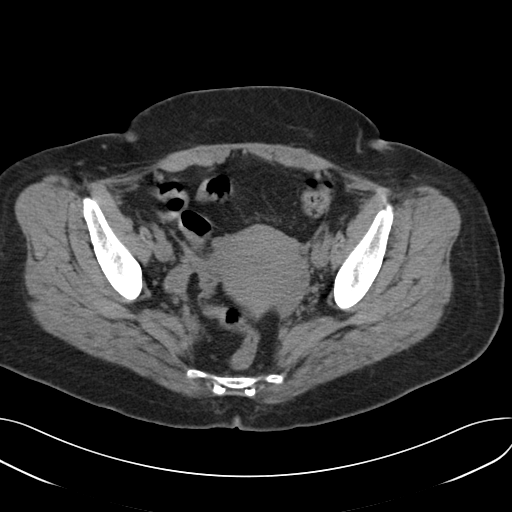
[im 30/88  soft-tissue]
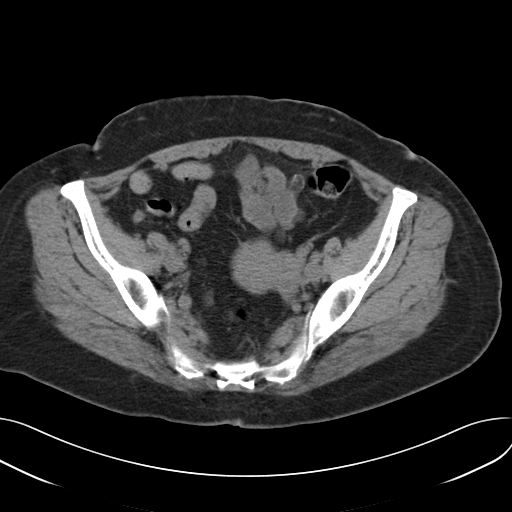
[im 37/88  soft-tissue]
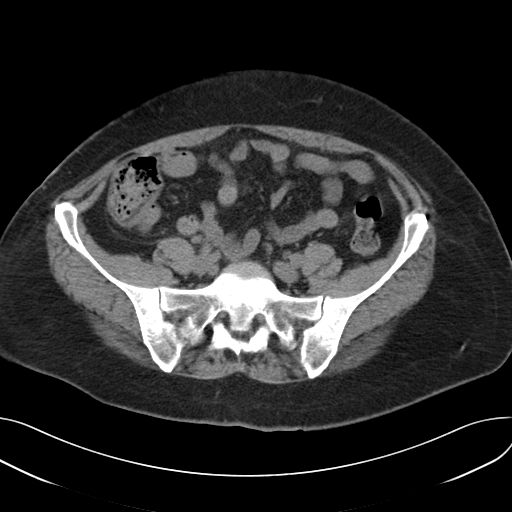
[im 44/88  soft-tissue]
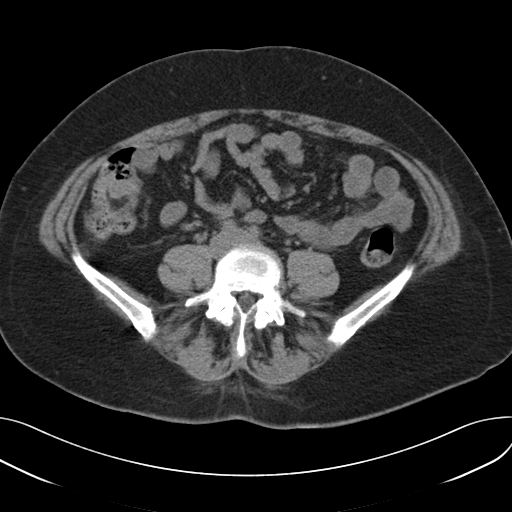
[im 51/88  soft-tissue]
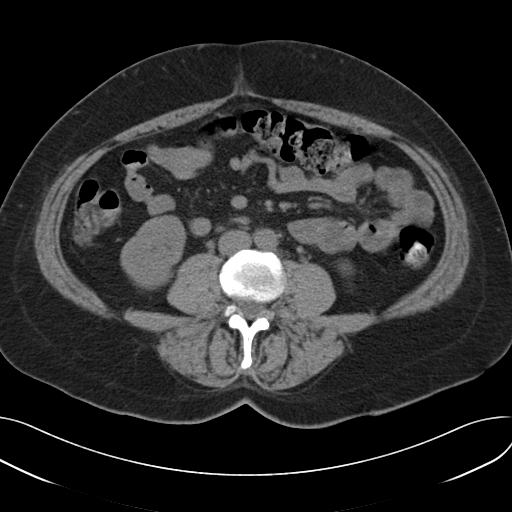
[im 59/88  soft-tissue]
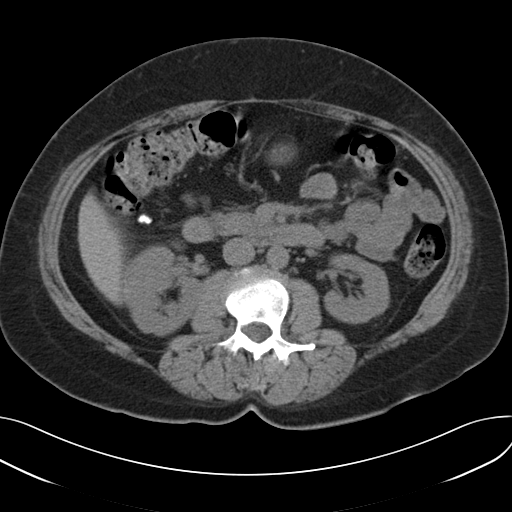
[im 59/88  bone]
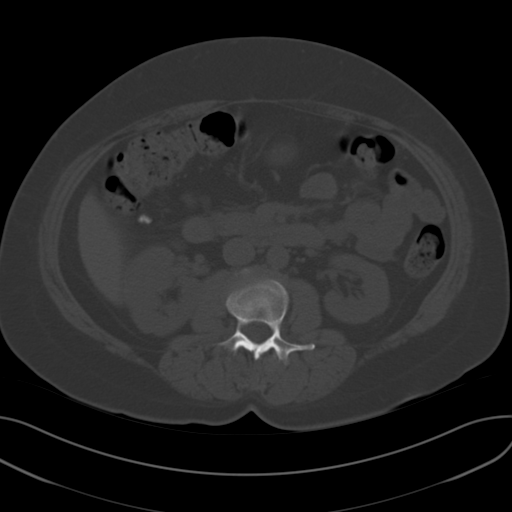
[im 62/88  soft-tissue]
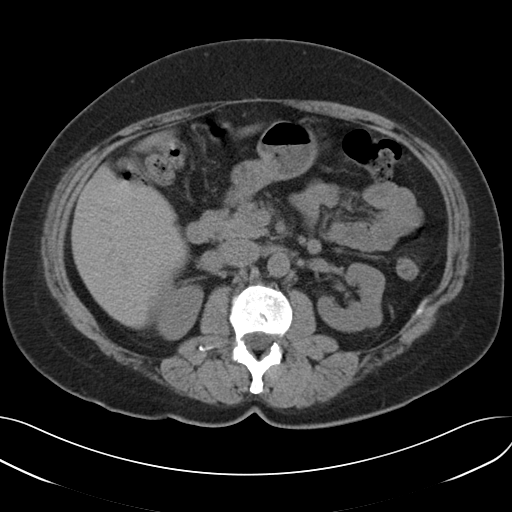
[im 69/88  soft-tissue]
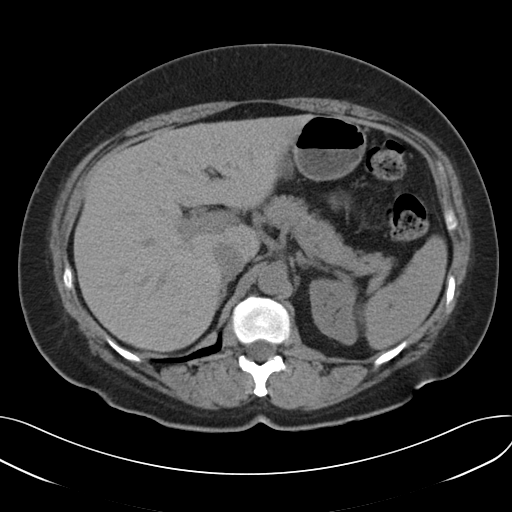
[im 73/88  lung]
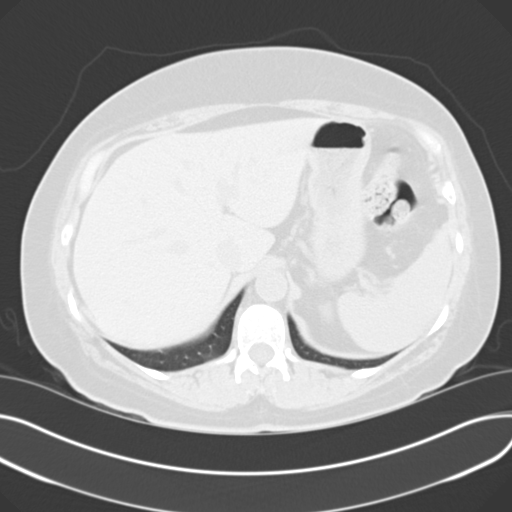
[im 77/88  soft-tissue]
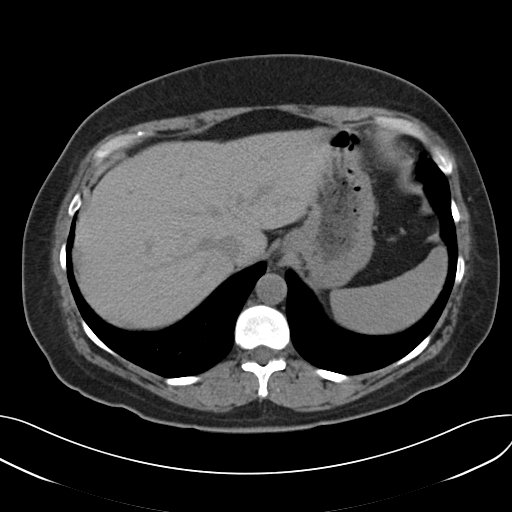
[im 77/88  lung]
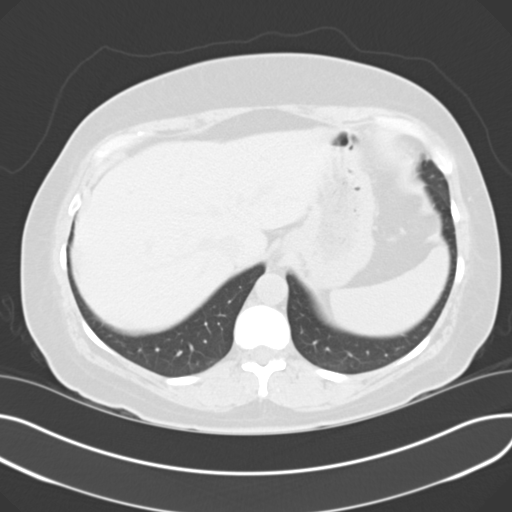
[im 80/88  lung]
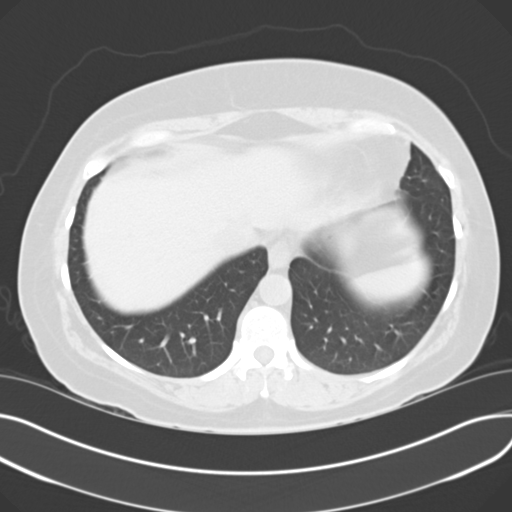
[im 84/88  soft-tissue]
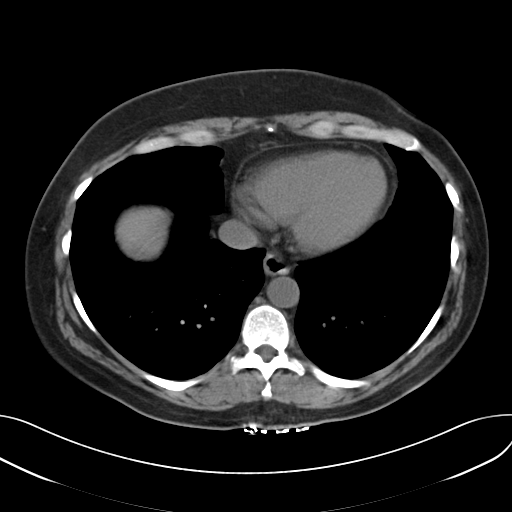
[im 84/88  lung]
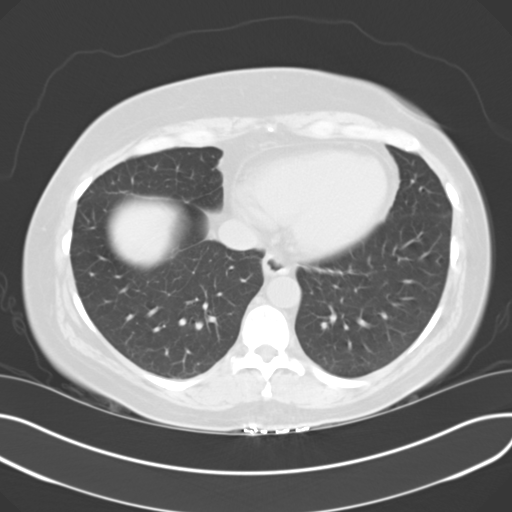

[15 of 32 positions shown; findings below may reference images not displayed]

FINDINGS: Lung bases clear.

Normal noncontrast CT appearance of the kidneys in ureters without
urinary tract calcification or dilatation.

Within limits of a nonenhanced exam, liver, gallbladder, spleen,
pancreas, kidneys and adrenal glands normal.

Normal appendix.

Bladder, ureters, uterus and adnexa unremarkable.

Stomach and bowel loops normal appearance for technique.

No mass, adenopathy, free fluid, free air or inflammatory process.

Minimal chronic appearing superior endplate irregularity and height
loss of L2 vertebral body.
IMPRESSION: No acute intra-abdominal or intrapelvic abnormalities.

## 2016-08-01 ENCOUNTER — Emergency Department
Admission: EM | Admit: 2016-08-01 | Discharge: 2016-08-02 | Disposition: A | Discharge status: Home / Self Care | Payer: No Typology Code available for payment source | Attending: Emergency Medicine | Admitting: Emergency Medicine

## 2016-08-01 ENCOUNTER — Encounter: Payer: Self-pay | Admitting: Emergency Medicine

## 2016-08-01 DIAGNOSIS — Z79899 Other long term (current) drug therapy: Secondary | ICD-10-CM | POA: Insufficient documentation

## 2016-08-01 DIAGNOSIS — F17210 Nicotine dependence, cigarettes, uncomplicated: Secondary | ICD-10-CM | POA: Insufficient documentation

## 2016-08-01 DIAGNOSIS — N3090 Cystitis, unspecified without hematuria: Secondary | ICD-10-CM | POA: Insufficient documentation

## 2016-08-01 LAB — URINALYSIS, COMPLETE (UACMP) WITH MICROSCOPIC
BACTERIA UA: NONE SEEN
BILIRUBIN URINE: NEGATIVE
Glucose, UA: NEGATIVE mg/dL
Ketones, ur: NEGATIVE mg/dL
LEUKOCYTES UA: NEGATIVE
Nitrite: NEGATIVE
PROTEIN: NEGATIVE mg/dL
RBC / HPF: NONE SEEN RBC/hpf (ref 0–5)
SPECIFIC GRAVITY, URINE: 1.001 — AB (ref 1.005–1.030)
WBC UA: NONE SEEN WBC/hpf (ref 0–5)
pH: 6 (ref 5.0–8.0)

## 2016-08-01 MED ORDER — CEPHALEXIN 500 MG PO CAPS: 500.0000 mg | ORAL_CAPSULE | Freq: Two times a day (BID) | ORAL | 0 refills | Status: DC

## 2016-08-01 MED ORDER — CEFTRIAXONE SODIUM 1 G IJ SOLR
1.0000 g | Freq: Once | INTRAMUSCULAR | Status: AC
  Administered 2016-08-02: 1 g via INTRAMUSCULAR
  Filled 2016-08-01: qty 10

## 2016-08-01 NOTE — ED Notes (Signed)
Pt sts that family out in lobby, this RN went out to lobby. Called out family member's name and pts last name w/ no response.

## 2016-08-01 NOTE — ED Provider Notes (Signed)
[facility] Emergency Department Provider Note  ____________________________________________  Time seen: Approximately 11:59 PM  I have reviewed the triage vital signs and the nursing notes.   HISTORY  Chief Complaint Urinary Frequency    HPI Shirley Grant is a 51 y.o. female who complains of dysuria frequency and urgency for the past 2 days. Reports that 2 weeks ago she was treated for UTI by her doctor, and symptoms improved but now have recurred quickly. He reports multiple UTIs in the past. 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 She reports Keflex and Levaquin have also worked in the past and this time recently she was treated with Macrobid. Denies fever chills or flank pain. No vomiting. Normal oral intake. Pain is waxing and waning and intermittent. Moderate intensity and present, worse with urination, no alleviating factors   Past Medical History:  Diagnosis Date  . Anxiety   . Depression      Patient Active Problem List   Diagnosis Date Noted  . Plantar fascial fibromatosis 09/06/2015     Past Surgical History:  Procedure Laterality Date  . CARPAL TUNNEL RELEASE       Prior to Admission medications   Medication Sig Start Date End Date Taking? Authorizing Provider  ALPRAZolam Prudy Feeler) 1 MG tablet Take 1 mg by mouth at bedtime as needed for anxiety.    [provider]  cephALEXin (KEFLEX) 500 MG capsule Take 1 capsule (500 mg total) by mouth 2 (two) times daily. 08/01/16   Sharman Cheek, MD  escitalopram (LEXAPRO) 20 MG tablet Take 20 mg by mouth daily.    [provider]  NONFORMULARY OR COMPOUNDED ITEM [facility]:  Scar Cream - Verapamil 10%, Pentoxifylline 5%, apply 1-2 grams to affected area 3-4 times daily. 09/23/15   Vivi Barrack, DPM  zolpidem (AMBIEN) 10 MG tablet Take 10 mg by mouth at bedtime as needed for sleep.    [provider]     Allergies Codeine   History  reviewed. No pertinent family history.  Social History Social History  Substance Use Topics  . Smoking status: Current Every Day Smoker    Packs/day: 1.00    Years: 32.00    Types: Cigarettes  . Smokeless tobacco: Former Neurosurgeon  . Alcohol use No    Review of Systems  Constitutional:   No fever or chills.  ENT:   No sore throat. No rhinorrhea. Cardiovascular:   No chest pain or syncope. Respiratory:   No dyspnea or cough. Gastrointestinal:   Negative for abdominal pain, vomiting and diarrhea.  Musculoskeletal:   Negative for focal pain or swelling All other systems reviewed and are negative except as documented above in ROS and HPI.  ____________________________________________   PHYSICAL EXAM:  VITAL SIGNS: ED Triage Vitals [08/01/16 2145]  Enc Vitals Group     BP (!) 164/95     Pulse Rate 99     Resp 20     Temp 98.1 F (36.7 C)     Temp Source Oral     SpO2 97 %     Weight 155 lb (70.3 kg)     Height 5\' 7"  (1.702 m)     Head Circumference      Peak Flow      Pain Score 0     Pain Loc      Pain Edu?      Excl. in GC?     Vital signs reviewed, nursing assessments reviewed.  Constitutional:   Alert and oriented. Well appearing and in no distress. Eyes:   No scleral icterus.  EOMI.  ENT   Head:   Normocephalic and atraumatic.    Neck:   No meningismus. Full ROM Hematological/Lymphatic/Immunilogical:   No cervical lymphadenopathy. Cardiovascular:   RRR. Symmetric bilateral radial and DP pulses.  No murmurs.  Respiratory:   Normal respiratory effort without tachypnea/retractions. Breath sounds are clear and equal bilaterally. No wheezes/rales/rhonchi. Gastrointestinal:   Soft and nontender. Non distended. There is no CVA tenderness.  No rebound, rigidity, or guarding. Genitourinary:   deferred Musculoskeletal:   Normal range of motion in all extremities. No joint effusions.  No lower extremity tenderness.  No edema. Neurologic:   Normal speech and  language.  Motor grossly intact. No gross focal neurologic deficits are appreciated.  Skin:    Skin is warm, dry and intact. No rash noted.  No petechiae, purpura, or bullae.  ____________________________________________    LABS (pertinent positives/negatives) (all labs ordered are listed, but only abnormal results are displayed) Labs Reviewed  URINALYSIS, COMPLETE (UACMP) WITH MICROSCOPIC - Abnormal; Notable for the following:       Result Value   Color, Urine COLORLESS (*)    APPearance CLEAR (*)    Specific Gravity, Urine 1.001 (*)    Hgb urine dipstick SMALL (*)    Squamous Epithelial / LPF 0-5 (*)    All other components within normal limits  URINE CULTURE   ____________________________________________   EKG    ____________________________________________    RADIOLOGY  No results found.  ____________________________________________   PROCEDURES Procedures  ____________________________________________   INITIAL IMPRESSION / ASSESSMENT AND PLAN / ED COURSE  Pertinent labs & imaging results that were available during my care of the patient were reviewed by me and considered in my medical decision making (see chart for details).  Patient well appearing no acute distress, presents with classic symptoms for UTI which she has had multiple times before. UA is essentially normal, but she has recently been treated with antibiotics which may confound the utility of this test. Urine culture sent, will treat her with ceftriaxone intramuscular and Keflex oral, follow-up with her doctor in 2 days. Patient agrees. No evidence of sepsis or distress. Considering the patient's symptoms, medical history, and physical examination today, I have low suspicion for cholecystitis or biliary pathology, pancreatitis, perforation or bowel obstruction, hernia, intra-abdominal abscess, AAA or dissection, volvulus or intussusception, mesenteric ischemia, or appendicitis. Low suspicion of STI  PID TOA or torsion.       ____________________________________________   FINAL CLINICAL IMPRESSION(S) / ED DIAGNOSES  Final diagnoses:  Cystitis      New Prescriptions   CEPHALEXIN (KEFLEX) 500 MG CAPSULE    Take 1 capsule (500 mg total) by mouth 2 (two) times daily.     Portions of this note were generated with dragon dictation software. Dictation errors may occur despite best attempts at proofreading.    Sharman CheekStafford, Natallie Ravenscroft, MD 08/02/16 Marlyne [id number]

## 2016-08-01 NOTE — ED Triage Notes (Signed)
Pt arrived to the ED for complaints of urinary frequency and difficulty urinating. Pt states that she had an UTI in the past and this feels like it. Pt is AOx4 in no apparent distress.

## 2016-08-03 LAB — URINE CULTURE

## 2017-04-18 ENCOUNTER — Ambulatory Visit: Payer: Self-pay | Admitting: Obstetrics and Gynecology

## 2017-04-27 ENCOUNTER — Ambulatory Visit: Payer: Self-pay | Admitting: Obstetrics and Gynecology

## 2018-07-12 ENCOUNTER — Ambulatory Visit: Payer: Self-pay | Admitting: Family Medicine

## 2018-07-26 ENCOUNTER — Ambulatory Visit: Payer: Self-pay | Admitting: Family Medicine

## 2018-08-01 ENCOUNTER — Encounter: Payer: Self-pay | Admitting: Family Medicine

## 2018-08-01 ENCOUNTER — Other Ambulatory Visit: Payer: Self-pay

## 2018-08-01 ENCOUNTER — Ambulatory Visit: Payer: Self-pay | Admitting: Family Medicine

## 2018-08-01 VITALS — BP 133/78 | HR 90 | Temp 98.2°F | Ht 66.0 in | Wt 192.6 lb

## 2018-08-01 DIAGNOSIS — M722 Plantar fascial fibromatosis: Secondary | ICD-10-CM

## 2018-08-01 DIAGNOSIS — E669 Obesity, unspecified: Secondary | ICD-10-CM | POA: Insufficient documentation

## 2018-08-01 DIAGNOSIS — G4700 Insomnia, unspecified: Secondary | ICD-10-CM | POA: Insufficient documentation

## 2018-08-01 DIAGNOSIS — F1021 Alcohol dependence, in remission: Secondary | ICD-10-CM | POA: Insufficient documentation

## 2018-08-01 DIAGNOSIS — Z7689 Persons encountering health services in other specified circumstances: Secondary | ICD-10-CM

## 2018-08-01 MED ORDER — NAPROXEN 500 MG PO TABS: 500.0000 mg | ORAL_TABLET | Freq: Two times a day (BID) | ORAL | 1 refills | Status: DC

## 2018-08-01 NOTE — Patient Instructions (Addendum)
Thank you for coming to the office today.  Signed form for benadryl.  You most likely have Plantar Fasciitis of heel / foot. - This is inflammation of the fibrous connection on the bottom of the foot, and can have small micro tears over time that become painful. It usually will have flare ups lasting days to weeks, and may come back after it heals if it is re-aggravated again. - Often there is a bone spur or arthritis of the heel bone that causes this - Also it may be caused by abnormal footwear, walking pattern or other problems  If you are experiencing an acute flare with pain, this is usually worst first thing in the morning when the plantar fascia is tight and stiff. First step out of bed is painful usually, and it may gradually improve with stretching and walking.  Recommend trial of Anti-inflammatory with Naproxen (Naprosyn) 500mg  tabs - take one with food and plenty of water TWICE daily every day (breakfast and dinner), for next 2 to 4 weeks, then you may take only as needed - DO NOT TAKE any ibuprofen, aleve, motrin while you are taking this medicine  - It is safe to take Tylenol Ext Str 500mg  tabs - take 1 to 2 (max dose 1000mg ) every 6 hours as needed for breakthrough pain, max 24 hour daily dose is 6 to 8 tablets or 4000mg   Recommend: - Rest / relative rest with activity modification avoid overuse / prolonged stand - Ice packs (make sure you use a towel or sock / something to protect skin)  May also try topical muscle rub, icy hot, tiger balm  Start the exercises listed below, gradually increase them as instructed. May be sore at first and hopefully will stretch out and help reduce pain later.  If not improving after 1-2 weeks on medicine, and stretching exercises and some rest - then can contact our office again for re-evaluation may consider other causes or can refer you to Orthopedic specialist to have second opinion, and consider a steroid injection.  DUE for FASTING BLOOD  WORK (no food or drink after midnight before the lab appointment, only water or coffee without cream/sugar on the morning of)  SCHEDULE "Lab Only" visit in the morning at the clinic for lab draw in 3 MONTHS   - Make sure Lab Only appointment is at about 1 week before your next appointment, so that results will be available  For Lab Results, once available within 2-3 days of blood draw, you can can log in to MyChart online to view your results and a brief explanation. Also, we can discuss results at next follow-up visit.    Please schedule a Follow-up Appointment to: Return in about 3 months (around 11/01/2018) for Annual Physical.  If you have any other questions or concerns, please feel free to call the office or send a message through [facility]. You may also schedule an earlier appointment if necessary.  Additionally, you may be receiving a survey about your experience at our office within a few days to 1 week by e-mail or mail. We value your feedback.  Nobie Putnam, DO [facility], [facility]              Plantar Fascia Stretches / Exercises  See other page with pictures of each exercise.  Start with 1 or 2 of these exercises that you are most comfortable with. Do not do any exercises that cause you significant worsening pain. Some of these may cause  some "stretching soreness" but it should go away after you stop the exercise, and get better over time. Gradually increase up to 3-4 exercises as tolerated.  You may begin exercising the muscles of your foot right away by gently stretching them as follows:  Stretching: Towel stretch: Sit on a hard surface with your injured leg stretched out in front of you. Loop a towel around the ball of your foot and pull the towel toward your body keeping your knee straight. Hold this position for 15 to 30 seconds then relax. Repeat 3 times. When the towel stretch becomes to easy, you may begin doing the standing calf  stretch.  Standing calf stretch: Facing a wall, put your hands against the wall at about eye level. Keep the injured leg back, the uninjured leg forward, and the heel of your injured leg on the floor. Turn your injured foot slightly inward (as if you were pigeon-toed) as you slowly lean into the wall until you feel a stretch in the back of your calf. Hold for 15 to 30 seconds. Repeat 3 times. Do this exercise several times each day. When you can stand comfortably on your injured foot, you can begin stretching the bottom of your foot using the plantar fascia stretch.  Plantar fascia stretch: Stand with the ball of your injured foot on a stair. Reach for the bottom step with your heel until you feel a stretch in the arch of your foot. Hold this position for 15 to 30 seconds and then relax. Repeat 3 times. After you have stretched the bottom muscles of your foot, you can begin strengthening the top muscles of your foot.  Frozen can roll: Roll your bare injured foot back and forth from your heel to your mid-arch over a frozen juice can. Repeat for 3 to 5 minutes. This exercise is particularly helpful if done first thing in the morning. Towel pickup: With your heel on the ground, pick up a towel with your toes. Release. Repeat 10 to 20 times. When this gets easy, add more resistance by placing a book or small weight on the towel. Static and dynamic balance exercises Place a chair next to your non-injured leg and stand upright. (This will provide you with balance if needed.) Stand on your injured foot. Try to raise the arch of your foot while keeping your toes on the floor. Try to maintain this position and balance on your injured side for 30 seconds. This exercise can be made more difficult by doing it on a piece of foam or a pillow, or with your eyes closed. Stand in the same position as above. Keep your foot in this position and reach forward in front of you with your injured side's hand, allowing your knee  to bend. Repeat this 10 times while maintaining the arch height. This exercise can be made more difficult by reaching farther in front of you. Do 2 sets. Stand in the same position as above. While maintaining your arch height, reach the injured side's hand across your body toward the chair. The farther you reach, the more challenging the exercise. Do 2 sets of 10.  Next, you can begin strengthening the muscles of your foot and lower leg by using elastic tubing.  Strengthening: Resisted dorsiflexion: Sit with your injured leg out straight and your foot facing a doorway. Tie a loop in one end of the tubing. Put your foot through the loop so that the tubing goes around the arch of your foot. Stann Mainlandie  a knot in the other end of the tubing and shut the knot in the door. Move backward until there is tension in the tubing. Keeping your knee straight, pull your foot toward your body, stretching the tubing. Slowly return to the starting position. Do 3 sets of 10. Resisted plantar flexion: Sit with your leg outstretched and loop the middle section of the tubing around the ball of your foot. Hold the ends of the tubing in both hands. Gently press the ball of your foot down and point your toes, stretching the tubing. Return to the starting position. Do 3 sets of 10. Resisted inversion: Sit with your legs out straight and cross your uninjured leg over your injured ankle. Wrap the tubing around the ball of your injured foot and then loop it around your uninjured foot so that the tubing is anchored there at one end. Hold the other end of the tubing in your hand. Turn your injured foot inward and upward. This will stretch the tubing. Return to the starting position. Do 3 sets of 10. Resisted eversion: Sit with both legs stretched out in front of you, with your feet about a shoulder's width apart. Tie a loop in one end of the tubing. Put your injured foot through the loop so that the tubing goes around the arch of that foot and  wraps around the outside of the uninjured foot. Hold onto the other end of the tubing with your hand to provide tension. Turn your injured foot up and out. Make sure you keep your uninjured foot still so that it will allow the tubing to stretch as you move your injured foot. Return to the starting position. Do 3 sets of 10.

## 2018-08-01 NOTE — Progress Notes (Signed)
Subjective:    Patient ID: Shirley MoatStephanie M Siefring, female    DOB: 01-18-1966, 53 y.o.   MRN: [id number]  Shirley Grant is a 53 y.o. female presenting on 08/01/2018 for Establish Care (foot pain x 6 mths ) and Foot Pain (plantar fascia)  Previous PCP Nurse Practition[place] locally in [place] >2 years ago.  HPI   Alcohol Use in remission Reports that she is abstaining and sober off of alcohol now since March 2019. She reports that she did have history of getting a DWI due to alcohol. She has proceeded with all steps necessary and has been doing well off alcohol. 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 She needs documentation of rx medication completed on form today. She takes Benadryl 50mg  nightly (x 2 of the 25mg  tabs) OTC for insomnia and sleep, with good results. She has past history on other medications such as Xanax and Ambien, no longer taking these medications.  Additional concerns Plantar Fasciitis, Chronic Foot Pain R - Reported that 15 years ago, procedure orthotripsy, plantar fasciitis, and even most recently had [facility], record on chart, in 2017 for injection and rx compounded topical cream, limited relief. Some improve on injection. Has episodic worsening if on feet prolong, and also worse if prolong sit then foot will get stiff and sore with walking again, trying stretches.  Depression screen PHQ 2/9 08/01/2018  Decreased Interest 0  Down, Depressed, Hopeless 0  PHQ - 2 Score 0    Past Medical History:  Diagnosis Date  . Allergy   . Anxiety   . Depression    Past Surgical History:  Procedure Laterality Date  . CARPAL TUNNEL RELEASE     Social History   Socioeconomic History  . Marital status: Divorced    Spouse name: Not on file  . Number of children: Not on file  . Years of education: Not on file  . Highest education level: Not on file   Occupational History  . Not on file  Social Needs  . Financial resource strain: Not on file  . Food insecurity    Worry: Not on file    Inability: Not on file  . Transportation needs    Medical: Not on file    Non-medical: Not on file  Tobacco Use  . Smoking status: Current Every Day Smoker    Packs/day: 0.50    Years: 32.00    Pack years: 16.00    Types: Cigarettes  . Smokeless tobacco: Never Used  Substance and Sexual Activity  . Alcohol use: No  . Drug use: Never  . Sexual activity: Yes    Birth control/protection: None  Lifestyle  . Physical activity    Days per week: Not on file    Minutes per session: Not on file  . Stress: Not on file  Relationships  . Social Musicianconnections    Talks on phone: Not on file    Gets together: Not on file    Attends religious service: Not on file    Active member of club or organization: Not on file    Attends meetings of clubs or organizations: Not on file    Relationship status: Not on file  . Intimate partner violence    Fear of current or ex partner: Not on file    Emotionally abused: Not on file  Physically abused: Not on file    Forced sexual activity: Not on file  Other Topics Concern  . Not on file  Social History Narrative  . Not on file   Family History  Problem Relation Age of Onset  . Heart disease Mother   . Congestive Heart Failure Mother   . Heart disease Father   . Hypertension Father   . Drug abuse Father    Current Outpatient Medications on File Prior to Visit  Medication Sig  . acetaminophen (TYLENOL) 500 MG tablet Take 1,000 mg by mouth every 6 (six) hours as needed.  . cetirizine (ZYRTEC) 10 MG tablet Take 10 mg by mouth as needed for allergies.  . diphenhydrAMINE (BENADRYL) 50 MG tablet Take 50 mg by mouth at bedtime as needed for itching.  . fluticasone (FLONASE) 50 MCG/ACT nasal spray Place 1 spray into both nostrils daily as needed for allergies or rhinitis.  Marland Kitchen ibuprofen (ADVIL) 200 MG tablet Take  800 mg by mouth every 6 (six) hours as needed.  . NONFORMULARY OR COMPOUNDED ITEM [facility]:  Scar Cream - Verapamil 10%, Pentoxifylline 5%, apply 1-2 grams to affected area 3-4 times daily. (Patient not taking: Reported on 08/01/2018)   No current facility-administered medications on file prior to visit.     Review of Systems Per HPI unless specifically indicated above      Objective:    BP 133/78 (BP Location: Right Arm, Patient Position: Sitting, Cuff Size: Large)   Pulse 90   Temp 98.2 F (36.8 C) (Oral)   Ht 5\' 6"  (1.676 m)   Wt 192 lb 9.6 oz (87.4 kg)   BMI 31.09 kg/m   Wt Readings from Last 3 Encounters:  08/01/18 192 lb 9.6 oz (87.4 kg)  08/01/16 155 lb (70.3 kg)  01/12/16 155 lb (70.3 kg)    Physical Exam Vitals signs and nursing note reviewed.  Constitutional:      General: She is not in acute distress.    Appearance: She is well-developed. She is not diaphoretic.     Comments: Well-appearing, comfortable, cooperative  HENT:     Head: Normocephalic and atraumatic.  Eyes:     General:        Right eye: No discharge.        Left eye: No discharge.     Conjunctiva/sclera: Conjunctivae normal.  Cardiovascular:     Rate and Rhythm: Normal rate.  Pulmonary:     Effort: Pulmonary effort is normal.  Skin:    General: Skin is warm and dry.     Findings: No erythema or rash.  Neurological:     Mental Status: She is alert and oriented to person, place, and time.  Psychiatric:        Behavior: Behavior normal.     Comments: Well groomed, good eye contact, normal speech and thoughts    Results for orders placed or performed during the hospital encounter of 08/01/16  Urine Culture   Specimen: Urine, Random  Result Value Ref Range   Specimen Description URINE, RANDOM    Special Requests NONE    Culture MULTIPLE SPECIES PRESENT, SUGGEST RECOLLECTION (A)    Report Status 08/03/2016 FINAL   Urinalysis, Complete w Microscopic  Result Value Ref Range    Color, Urine COLORLESS (A) YELLOW   APPearance CLEAR (A) CLEAR   Specific Gravity, Urine 1.001 (L) 1.005 - 1.030   pH 6.0 5.0 - 8.0   Glucose, UA NEGATIVE NEGATIVE mg/dL   Hgb urine  dipstick SMALL (A) NEGATIVE   Bilirubin Urine NEGATIVE NEGATIVE   Ketones, ur NEGATIVE NEGATIVE mg/dL   Protein, ur NEGATIVE NEGATIVE mg/dL   Nitrite NEGATIVE NEGATIVE   Leukocytes, UA NEGATIVE NEGATIVE   RBC / HPF NONE SEEN 0 - 5 RBC/hpf   WBC, UA NONE SEEN 0 - 5 WBC/hpf   Bacteria, UA NONE SEEN NONE SEEN   Squamous Epithelial / LPF 0-5 (A) NONE SEEN   Mucus PRESENT       Assessment & Plan:   Problem List Items Addressed This Visit    Alcohol dependence in remission [facility](HCC) Reviewed history Reassurance and encouragement to remain abstained and alcohol free Proceed to complete her [place] [facility] Board Form today, sign off that she can take the OTC Benadryl 50mg  nightly for insomnia. Not on controlled substance. I have reviewed [place] CSRS database, last rx were previous providers back in 2018 None currently.  Returned form to her, copied, to be faxed to [facility]     Insomnia Continue Benadryl 50mg  nightly OTC Precautions given, see above    Obesity (BMI 30-39.9) Encourage weight loss    Plantar fasciitis of right foot - Primary  Clinically with chronic recurrent plantar fasciitis, R>L, seems related to activity provoked Complicated prior history with procedure, injections, and other therapy limited results it seems  Trial on NSAID temporarily Naproxen BID short term Advised exercises, handout per AVS given Follow up if want to see Podiatrist again for further treatment    Relevant Medications   naproxen (NAPROSYN) 500 MG tablet    Other Visit Diagnoses    Encounter to establish care with new doctor      Unable to provide detailed records from last PCP    Meds ordered this encounter  Medications  . naproxen (NAPROSYN) 500 MG tablet    Sig: Take 1 tablet (500 mg total) by mouth 2 (two)  times daily with a meal. For 2-4 weeks then as needed    Dispense:  60 tablet    Refill:  1    Follow up plan: Return in about 3 months (around 11/01/2018) for Annual Physical.  Future labs TBD if she requests, waiting on new insurance.  Saralyn PilarAlexander Karamalegos, DO [facility] 08/01/2018, 10:29 AM

## 2018-09-11 ENCOUNTER — Ambulatory Visit (INDEPENDENT_AMBULATORY_CARE_PROVIDER_SITE_OTHER): Payer: Self-pay | Admitting: Podiatry

## 2018-09-11 ENCOUNTER — Ambulatory Visit (INDEPENDENT_AMBULATORY_CARE_PROVIDER_SITE_OTHER): Payer: Self-pay

## 2018-09-11 ENCOUNTER — Encounter: Payer: Self-pay | Admitting: Podiatry

## 2018-09-11 ENCOUNTER — Other Ambulatory Visit: Payer: Self-pay

## 2018-09-11 VITALS — Temp 98.5°F

## 2018-09-11 DIAGNOSIS — M722 Plantar fascial fibromatosis: Secondary | ICD-10-CM

## 2018-09-11 MED ORDER — METHYLPREDNISOLONE 4 MG PO TBPK: ORAL_TABLET | ORAL | 0 refills | Status: DC

## 2018-09-11 MED ORDER — DICLOFENAC SODIUM 75 MG PO TBEC: 75.0000 mg | DELAYED_RELEASE_TABLET | Freq: Two times a day (BID) | ORAL | 1 refills | Status: DC

## 2018-09-14 NOTE — Progress Notes (Signed)
   Subjective: 53 y.o. female presenting today with a chief complaint of constant burning, sharp, shooting pain to the right heel and arch that began about 6 months ago. She states it feels like she is walking on a bruise and the pain is worse in the morning time. Walking throughout the day increases the pain. She has been icing, stretching, using a CAM boot and taking Advil with no significant relief. Patient is here for further evaluation and treatment.   Past Medical History:  Diagnosis Date  . Allergy   . Anxiety   . Depression      Objective: Physical Exam General: The patient is alert and oriented x3 in no acute distress.  Dermatology: Skin is warm, dry and supple bilateral lower extremities. Negative for open lesions or macerations bilateral.   Vascular: Dorsalis Pedis and Posterior Tibial pulses palpable bilateral.  Capillary fill time is immediate to all digits.  Neurological: Epicritic and protective threshold intact bilateral.   Musculoskeletal: Tenderness to palpation to the plantar aspect of the right heel along the plantar fascia. All other joints range of motion within normal limits bilateral. Strength 5/5 in all groups bilateral.   Radiographic exam: Normal osseous mineralization. Joint spaces preserved. No fracture/dislocation/boney destruction. No other soft tissue abnormalities or radiopaque foreign bodies.   Assessment: 1. Plantar fasciitis right  Plan of Care:  1. Patient evaluated. Xrays reviewed.   2. Injection of 0.5cc Celestone soluspan injected into the right plantar fascia  3. Rx for Medrol Dose Pack placed 4. Rx for Diclofenac ordered for patient. 5. Plantar fascial band(s) dispensed 6. Instructed patient regarding therapies and modalities at home to alleviate symptoms.  7. Return to clinic in 4 weeks.    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    Edrick Kins, DPM [facility]  Dr. Edrick Kins, DPM    [address], [place] [address]                Office [phone]  Fax [phone]

## 2018-10-12 ENCOUNTER — Ambulatory Visit (INDEPENDENT_AMBULATORY_CARE_PROVIDER_SITE_OTHER): Payer: Self-pay | Admitting: Podiatry

## 2018-10-12 ENCOUNTER — Other Ambulatory Visit: Payer: Self-pay

## 2018-10-12 ENCOUNTER — Encounter: Payer: Self-pay | Admitting: Podiatry

## 2018-10-12 DIAGNOSIS — M722 Plantar fascial fibromatosis: Secondary | ICD-10-CM

## 2018-10-14 NOTE — Progress Notes (Signed)
   Subjective: 53 y.o. female presenting today for follow up evaluation of plantar fasciitis of the right foot. She states her pain had improved until about two weeks ago. She states the injection helped alleviate her pain. She has been using the fascial brace all the time to help relieve her symptoms. Being on her foot for long periods of time makes her pain worse. Patient is here for further evaluation and treatment.   Past Medical History:  Diagnosis Date  . Allergy   . Anxiety   . Depression      Objective: Physical Exam General: The patient is alert and oriented x3 in no acute distress.  Dermatology: Skin is warm, dry and supple bilateral lower extremities. Negative for open lesions or macerations bilateral.   Vascular: Dorsalis Pedis and Posterior Tibial pulses palpable bilateral.  Capillary fill time is immediate to all digits.  Neurological: Epicritic and protective threshold intact bilateral.   Musculoskeletal: Tenderness to palpation to the plantar aspect of the right heel along the plantar fascia. All other joints range of motion within normal limits bilateral. Strength 5/5 in all groups bilateral.    Assessment: 1. Plantar fasciitis right - improved   Plan of Care:  1. Patient evaluated.    2. Injection of 0.5cc Celestone soluspan injected into the right plantar fascia  3. Continue taking Diclofenac.  4. Continue using plantar fascial brace and wearing [place] shoes.  5. Return to clinic as needed when ready for surgery once she gets insurance.   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    Edrick Kins, DPM [facility]  Dr. Edrick Kins, DPM    [address], [place] [address]                Office [phone]  Fax [phone]

## 2018-11-05 ENCOUNTER — Telehealth: Payer: Self-pay | Admitting: *Deleted

## 2018-11-05 MED ORDER — METHYLPREDNISOLONE 4 MG PO TBPK: ORAL_TABLET | ORAL | 0 refills | Status: DC

## 2018-11-05 NOTE — Telephone Encounter (Signed)
Pt request a refill of the steroid pack.

## 2018-11-05 NOTE — Telephone Encounter (Signed)
Dr. Trinidad Curet the refill of the Medrol dose pack, not to be taken with the antiinflammatory medications and if not improved needs an appt. I informed pt of Dr. Amalia Hailey orders.

## 2018-11-22 ENCOUNTER — Other Ambulatory Visit: Payer: Self-pay

## 2018-11-22 MED ORDER — DICLOFENAC SODIUM 75 MG PO TBEC: 75.0000 mg | DELAYED_RELEASE_TABLET | Freq: Two times a day (BID) | ORAL | 2 refills | Status: DC

## 2018-11-22 NOTE — Telephone Encounter (Signed)
Pharmacy refill request for Diclofenac 75mg  tablets.  Per Dr. Amalia Hailey, ok to refill     Script has been sent to pharmacy

## 2019-01-31 ENCOUNTER — Telehealth: Payer: Self-pay | Admitting: Podiatry

## 2019-01-31 NOTE — Telephone Encounter (Signed)
Pt would like to know if there is anything stronger that can be prescribed to help with her pain aside from Voltaren.  Pt has been taking the Voltaren for several weeks and is still experiencing severe pain.

## 2019-02-12 ENCOUNTER — Other Ambulatory Visit: Payer: Self-pay

## 2019-02-12 ENCOUNTER — Ambulatory Visit (INDEPENDENT_AMBULATORY_CARE_PROVIDER_SITE_OTHER): Payer: Self-pay | Admitting: Podiatry

## 2019-02-12 DIAGNOSIS — M722 Plantar fascial fibromatosis: Secondary | ICD-10-CM

## 2019-02-12 MED ORDER — DICLOFENAC SODIUM 75 MG PO TBEC: 75.0000 mg | DELAYED_RELEASE_TABLET | Freq: Two times a day (BID) | ORAL | 2 refills | Status: DC

## 2019-02-12 MED ORDER — METHYLPREDNISOLONE 4 MG PO TBPK: ORAL_TABLET | ORAL | 0 refills | Status: DC

## 2019-02-15 NOTE — Progress Notes (Signed)
   Subjective: 54 y.o. female presenting today for follow up evaluation of plantar fasciitis of the right foot. She reports a significant amount of continued pain in the foot. She states the injection helps alleviate the pain temporarily and she would like another one. She has ben using the plantar fascial brace and taking Diclofenac for treatment. Being on the foot increases the pain. Patient is here for further evaluation and treatment.   Past Medical History:  Diagnosis Date  . Allergy   . Anxiety   . Depression      Objective: Physical Exam General: The patient is alert and oriented x3 in no acute distress.  Dermatology: Skin is warm, dry and supple bilateral lower extremities. Negative for open lesions or macerations bilateral.   Vascular: Dorsalis Pedis and Posterior Tibial pulses palpable bilateral.  Capillary fill time is immediate to all digits.  Neurological: Epicritic and protective threshold intact bilateral.   Musculoskeletal: Tenderness to palpation to the plantar aspect of the right heel along the plantar fascia. All other joints range of motion within normal limits bilateral. Strength 5/5 in all groups bilateral.    Assessment: 1. Plantar fasciitis right - improved   Plan of Care:  1. Patient evaluated.    2. Injection of 0.5cc Celestone soluspan injected into the right plantar fascia  3. Refill prescription for Medrol Dose Pak provided to patient.  4. Refill prescription for Diclofenac provided to patient.  5. Continue using plantar fascial brace.  6. Return to clinic as needed when ready for surgery once she gets insurance.   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    Felecia Shelling, DPM [facility]  Dr. Felecia Shelling, DPM    [address] [place], [place] [address]                Office [phone]  Fax [phone]

## 2019-03-04 ENCOUNTER — Telehealth: Payer: Self-pay | Admitting: Podiatry

## 2019-03-04 NOTE — Telephone Encounter (Signed)
Called pt to let her know that I would not be able to give her that information. Stated we have been on EMR since 10/31/2012 and our old system has crashed and I don't have a way to give her that information. Pt stated that sucked because the medication Dr. Logan Bores has her on is tearing her stomach up. Pt thanked me for calling her back.

## 2019-03-04 NOTE — Telephone Encounter (Signed)
I saw Dr. Dayle Points a long long time ago. I'm trying to find out what kind of medicine he put me on and I don't know if you can tell me that or not. I'm seeing Dr. Logan Bores in the [place] office and I wanted to see if he could prescribe that for me. Its like a high powered NSAID. Thank you.

## 2019-05-07 ENCOUNTER — Other Ambulatory Visit: Payer: Self-pay

## 2019-05-07 ENCOUNTER — Ambulatory Visit (INDEPENDENT_AMBULATORY_CARE_PROVIDER_SITE_OTHER): Payer: Self-pay | Admitting: Podiatry

## 2019-05-07 ENCOUNTER — Encounter: Payer: Self-pay | Admitting: Podiatry

## 2019-05-07 DIAGNOSIS — Z5329 Procedure and treatment not carried out because of patient's decision for other reasons: Secondary | ICD-10-CM

## 2019-05-07 DIAGNOSIS — M722 Plantar fascial fibromatosis: Secondary | ICD-10-CM

## 2019-05-07 MED ORDER — METHYLPREDNISOLONE 4 MG PO TBPK: ORAL_TABLET | ORAL | 0 refills | Status: DC

## 2019-05-07 NOTE — Progress Notes (Signed)
No show for appt. 

## 2019-05-09 NOTE — Progress Notes (Signed)
   Subjective: 54 y.o. female presenting today with a chief complaint of right heel pain that flared up about three days ago. She describes the pain as burning pain and states it is intermittent. She has been using Voltaren gel which helps temporarily. She states taking oral Diclofenac does not provided any relief. She has gotten injections in the past which have helped alleviate her symptoms in the past. Patient is here for further evaluation and treatment.   Past Medical History:  Diagnosis Date  . Allergy   . Anxiety   . Depression      Objective: Physical Exam General: The patient is alert and oriented x3 in no acute distress.  Dermatology: Skin is warm, dry and supple bilateral lower extremities. Negative for open lesions or macerations bilateral.   Vascular: Dorsalis Pedis and Posterior Tibial pulses palpable bilateral.  Capillary fill time is immediate to all digits.  Neurological: Epicritic and protective threshold intact bilateral.   Musculoskeletal: Tenderness to palpation to the plantar aspect of the right heel along the plantar fascia. All other joints range of motion within normal limits bilateral. Strength 5/5 in all groups bilateral.   Assessment: 1. Plantar fasciitis right  Plan of Care:  1. Patient evaluated.   2. Injection of 0.5cc Celestone soluspan injected into the right plantar fascia  3. Continue using plantar fascial brace.  4. Patient currently does not have insurance. Hoping to have insurance in three months.  5. Prescription for Medrol Dose Pak provided to patient. Then recommended OTC Motrin.  6. Return to clinic needed for surgical consult.    Felecia Shelling, DPM [facility]  Dr. Felecia Shelling, DPM    [address] [place], [place] [address]                Office [phone]  Fax [phone]

## 2019-07-05 ENCOUNTER — Other Ambulatory Visit: Payer: Self-pay

## 2019-07-05 ENCOUNTER — Encounter: Payer: Self-pay | Admitting: Podiatry

## 2019-07-05 ENCOUNTER — Ambulatory Visit (INDEPENDENT_AMBULATORY_CARE_PROVIDER_SITE_OTHER): Payer: Self-pay | Admitting: Podiatry

## 2019-07-05 DIAGNOSIS — M722 Plantar fascial fibromatosis: Secondary | ICD-10-CM

## 2019-07-05 MED ORDER — METHYLPREDNISOLONE 4 MG PO TBPK: ORAL_TABLET | ORAL | 0 refills | Status: DC

## 2019-07-05 MED ORDER — NABUMETONE 750 MG PO TABS: 750.0000 mg | ORAL_TABLET | Freq: Two times a day (BID) | ORAL | 1 refills | Status: DC | PRN

## 2019-07-07 NOTE — Progress Notes (Signed)
   Subjective: 54 y.o. female presenting today for follow-up evaluation regarding plantar fasciitis to the right foot.  Patient states that her foot is hurting again.  She says the injections do help for a few weeks only.  She would like to discuss possible EPAT.  No new complaints at this time  Past Medical History:  Diagnosis Date  . Allergy   . Anxiety   . Depression      Objective: Physical Exam General: The patient is alert and oriented x3 in no acute distress.  Dermatology: Skin is warm, dry and supple bilateral lower extremities. Negative for open lesions or macerations bilateral.   Vascular: Dorsalis Pedis and Posterior Tibial pulses palpable bilateral.  Capillary fill time is immediate to all digits.  Neurological: Epicritic and protective threshold intact bilateral.   Musculoskeletal: Tenderness to palpation to the plantar aspect of the right heel along the plantar fascia. All other joints range of motion within normal limits bilateral. Strength 5/5 in all groups bilateral.   Assessment: 1. Plantar fasciitis right  Plan of Care:  1. Patient evaluated.   2. Injection of 0.5cc Celestone soluspan injected into the right plantar fascia  3. Continue using plantar fascial brace.  4.  Patient states that approximately 15 years ago when she had a plantar fascial flareup she believes she took Relafen.  Prescription today was provided for nabumetone 750 mg 2 times daily 5.  Appointment for EPAT scheduled for August if the patient does not get any lasting improvement 6.  Return to clinic as needed  Felecia Shelling, DPM [facility]  Dr. Felecia Shelling, DPM    [address] [place], [place] [address]                Office [phone]  Fax [phone]

## 2019-08-09 ENCOUNTER — Other Ambulatory Visit: Payer: Self-pay | Admitting: Podiatry

## 2019-09-06 ENCOUNTER — Other Ambulatory Visit: Payer: Self-pay

## 2019-11-19 ENCOUNTER — Ambulatory Visit (INDEPENDENT_AMBULATORY_CARE_PROVIDER_SITE_OTHER): Payer: Self-pay | Admitting: Podiatry

## 2019-11-19 ENCOUNTER — Other Ambulatory Visit: Payer: Self-pay

## 2019-11-19 DIAGNOSIS — M722 Plantar fascial fibromatosis: Secondary | ICD-10-CM

## 2019-11-19 MED ORDER — METHYLPREDNISOLONE 4 MG PO TBPK: ORAL_TABLET | ORAL | 0 refills | Status: DC

## 2019-11-19 MED ORDER — NABUMETONE 750 MG PO TABS: 750.0000 mg | ORAL_TABLET | Freq: Two times a day (BID) | ORAL | 2 refills | Status: DC | PRN

## 2019-11-19 NOTE — Progress Notes (Signed)
   Subjective: 54 y.o. female presenting today for follow-up evaluation regarding plantar fasciitis to the right foot.  Patient states that her foot is hurting again.  Patient states that the injection has helped significantly for the past 4-5 months since she was last seen in the office on 07/05/2019. Past Medical History:  Diagnosis Date  . Allergy   . Anxiety   . Depression      Objective: Physical Exam General: The patient is alert and oriented x3 in no acute distress.  Dermatology: Skin is warm, dry and supple bilateral lower extremities. Negative for open lesions or macerations bilateral.  There is a hyperkeratotic callus noted to the lateral aspect of the left foot with a central nucleated core consistent with a porokeratosis  Vascular: Dorsalis Pedis and Posterior Tibial pulses palpable bilateral.  Capillary fill time is immediate to all digits.  Neurological: Epicritic and protective threshold intact bilateral.   Musculoskeletal: Tenderness to palpation to the plantar aspect of the right heel along the plantar fascia. All other joints range of motion within normal limits bilateral. Strength 5/5 in all groups bilateral.   Assessment: 1. Plantar fasciitis right 2.  Porokeratosis left lateral foot  Plan of Care:  1. Patient evaluated.   2. Injection of 0.5cc Celestone soluspan injected into the right plantar fascia  3. Continue using plantar fascial brace.  4.  Prescription for Medrol Dosepak.  Refill prescription for nabumetone 750 mg 2 times daily to begin taking after completion of the Dosepak 5.  At the moment we will hold off on EPAT therapy  6.  Return to clinic as needed.  *Patient currently does not have health insurance  Felecia Shelling, DPM [facility]  Dr. Felecia Shelling, DPM    [address] [place], [place] [address]                Office [phone]  Fax [phone]

## 2019-11-20 ENCOUNTER — Ambulatory Visit: Payer: Self-pay | Admitting: Family Medicine

## 2020-01-13 ENCOUNTER — Ambulatory Visit (INDEPENDENT_AMBULATORY_CARE_PROVIDER_SITE_OTHER): Payer: Self-pay | Admitting: *Deleted

## 2020-01-13 ENCOUNTER — Other Ambulatory Visit: Payer: Self-pay

## 2020-01-13 DIAGNOSIS — B351 Tinea unguium: Secondary | ICD-10-CM

## 2020-01-13 DIAGNOSIS — M722 Plantar fascial fibromatosis: Secondary | ICD-10-CM

## 2020-01-13 NOTE — Patient Instructions (Signed)

## 2020-01-13 NOTE — Progress Notes (Signed)
Patient presents for the 1st EPAT treatment today with complaint of plantar heel pain right. Diagnosed with plantar fasciitis by Dr. Logan Bores. This has been ongoing for several months. The patient has tried ice, stretching, NSAIDS and supportive shoe gear with no long term relief.   Most of the pain is located more plantar lateral heel RIGHT.  ESWT administered and tolerated well.Treatment settings initiated at:   Energy: 12  Ended treatment session today with 3000 shocks at the following settings:   Energy: 12  Frequency: 6.0  Joules: 11.77   Reviewed post EPAT instructions. Advised to avoid ice and NSAIDs throughout the treatment process and to utilize boot or supportive shoes for at least the next 3 days.  Follow up for 2nd treatment in 2 weeks due to holiday schedule.

## 2020-01-16 ENCOUNTER — Ambulatory Visit (INDEPENDENT_AMBULATORY_CARE_PROVIDER_SITE_OTHER): Payer: No Typology Code available for payment source | Admitting: Family Medicine

## 2020-01-16 ENCOUNTER — Encounter: Payer: Self-pay | Admitting: Family Medicine

## 2020-01-16 ENCOUNTER — Other Ambulatory Visit: Payer: Self-pay

## 2020-01-16 VITALS — BP 142/84 | HR 107 | Temp 97.8°F | Resp 16 | Ht 66.0 in | Wt 184.0 lb

## 2020-01-16 DIAGNOSIS — I10 Essential (primary) hypertension: Secondary | ICD-10-CM | POA: Diagnosis not present

## 2020-01-16 DIAGNOSIS — R000 Tachycardia, unspecified: Secondary | ICD-10-CM

## 2020-01-16 DIAGNOSIS — Z1231 Encounter for screening mammogram for malignant neoplasm of breast: Secondary | ICD-10-CM

## 2020-01-16 DIAGNOSIS — Z0000 Encounter for general adult medical examination without abnormal findings: Secondary | ICD-10-CM | POA: Diagnosis not present

## 2020-01-16 DIAGNOSIS — R7309 Other abnormal glucose: Secondary | ICD-10-CM | POA: Diagnosis not present

## 2020-01-16 NOTE — Progress Notes (Signed)
Subjective:    Patient ID: Shirley Grant, female    DOB: 1965-04-20, 54 y.o.   MRN: [id number]  Shirley Grant is a 54 y.o. female presenting on 01/16/2020 for Hypertension   HPI   Annual Physical  CHRONIC HTN Overweight BMI >29 Reports not checking. Previously on Lisinopril 5mg  in past. Current Meds - None.   Lifestyle: Weight gain 30 lbs in past 3 years approx - Diet: admits eating sweets, caffeine 5mg  Admits facial flushing Works full time. Stress with caring for mother age 64. She is primary caregiver. Stress eating Nocturia - waking up overnight often.   Health Maintenance: Breast CA Screening: Due for mammogram screening. Last mammogram result >8 years ago negative. No known family history of breast cancer. Currently asymptomatic. Order mammogram she will schedule when ready  Colon CA Screening: Never had colonoscopy. Currently asymptomatic. No known family history of colon CA. Due for screening test considering Cologuard, counseling given she will contact when to order.  Due COVID booster  UTD Flu Shot  Pap smear is due - she will consider GYN for referral    Depression screen [facility] 2/9 01/16/2020 08/01/2018  Decreased Interest 0 0  Down, Depressed, Hopeless 0 0  PHQ - 2 Score 0 0    Social History   Tobacco Use  . Smoking status: Current Every Day Smoker    Packs/day: 0.50    Years: 32.00    Pack years: 16.00    Types: Cigarettes  . Smokeless tobacco: Never Used  Vaping Use  . Vaping Use: Never used  Substance Use Topics  . Alcohol use: No  . Drug use: Never    Review of Systems  Constitutional: Negative for activity change, appetite change, chills, diaphoresis, fatigue and fever.  HENT: Negative for congestion and hearing loss.   Eyes: Negative for visual disturbance.  Respiratory: Negative for cough, chest tightness, shortness of breath and wheezing.   Cardiovascular: Negative for chest pain, palpitations and leg swelling.   Gastrointestinal: Negative for abdominal pain, constipation, diarrhea, nausea and vomiting.  Endocrine: Negative for cold intolerance.  Genitourinary: Negative for dysuria, frequency and hematuria.  Musculoskeletal: Negative for arthralgias and neck pain.  Skin: Negative for rash.  Allergic/Immunologic: Negative for environmental allergies.  Neurological: Negative for dizziness, weakness, light-headedness, numbness and headaches.  Hematological: Negative for adenopathy.  Psychiatric/Behavioral: Negative for behavioral problems, dysphoric mood and sleep disturbance.   Per HPI unless specifically indicated above     Objective:    BP (!) 142/84 (BP Location: Left Arm, Cuff Size: Normal)   Pulse (!) 107   Temp 97.8 F (36.6 C) (Temporal)   Resp 16   Ht 5\' 6"  (1.676 m)   Wt 184 lb (83.5 kg)   SpO2 100%   BMI 29.70 kg/m   Wt Readings from Last 3 Encounters:  01/16/20 184 lb (83.5 kg)  08/01/18 192 lb 9.6 oz (87.4 kg)  08/01/16 155 lb (70.3 kg)    Physical Exam Vitals and nursing note reviewed.  Constitutional:      General: She is not in acute distress.    Appearance: She is well-developed and well-nourished. She is not diaphoretic.     Comments: Well-appearing, comfortable, cooperative  HENT:     Head: Normocephalic and atraumatic.     Mouth/Throat:     Mouth: Oropharynx is clear and moist.  Eyes:     General:        Right eye: No discharge.  Left eye: No discharge.     Conjunctiva/sclera: Conjunctivae normal.  Neck:     Thyroid: No thyromegaly.  Cardiovascular:     Rate and Rhythm: Normal rate and regular rhythm.     Pulses: Intact distal pulses.     Heart sounds: Normal heart sounds. No murmur heard.   Pulmonary:     Effort: Pulmonary effort is normal. No respiratory distress.     Breath sounds: Normal breath sounds. No wheezing or rales.  Musculoskeletal:        General: No edema. Normal range of motion.     Cervical back: Normal range of motion and  neck supple.     Right lower leg: No edema.     Left lower leg: No edema.  Lymphadenopathy:     Cervical: No cervical adenopathy.  Skin:    General: Skin is warm and dry.     Findings: No erythema or rash.  Neurological:     Mental Status: She is alert and oriented to person, place, and time.  Psychiatric:        Mood and Affect: Mood and affect normal.        Behavior: Behavior normal.     Comments: Well groomed, good eye contact, normal speech and thoughts       Results for orders placed or performed during the hospital encounter of 08/01/16  Urine Culture   Specimen: Urine, Random  Result Value Ref Range   Specimen Description URINE, RANDOM    Special Requests NONE    Culture MULTIPLE SPECIES PRESENT, SUGGEST RECOLLECTION (A)    Report Status 08/03/2016 FINAL   Urinalysis, Complete w Microscopic  Result Value Ref Range   Color, Urine COLORLESS (A) YELLOW   APPearance CLEAR (A) CLEAR   Specific Gravity, Urine 1.001 (L) 1.005 - 1.030   pH 6.0 5.0 - 8.0   Glucose, UA NEGATIVE NEGATIVE mg/dL   Hgb urine dipstick SMALL (A) NEGATIVE   Bilirubin Urine NEGATIVE NEGATIVE   Ketones, ur NEGATIVE NEGATIVE mg/dL   Protein, ur NEGATIVE NEGATIVE mg/dL   Nitrite NEGATIVE NEGATIVE   Leukocytes, UA NEGATIVE NEGATIVE   RBC / HPF NONE SEEN 0 - 5 RBC/hpf   WBC, UA NONE SEEN 0 - 5 WBC/hpf   Bacteria, UA NONE SEEN NONE SEEN   Squamous Epithelial / LPF 0-5 (A) NONE SEEN   Mucus PRESENT       Assessment & Plan:   Problem List Items Addressed This Visit    Essential hypertension    Mildly elevated initial BP, repeat manual check improved. - Home BP readings none No known complications    Plan:  1. Discuss options considered resume low dose monotherapy BP med in past lisinopril - agree hold off on med, improve lifestyle, reduce caffeine, wt loss first as trial 2. Encourage improved lifestyle - low sodium diet, regular exercise 3. Start monitor BP outside office, bring readings to  next visit, if persistently >140/90 or new symptoms notify office sooner      Relevant Orders   CBC with Differential/Platelet   COMPLETE METABOLIC PANEL WITH GFR    Other Visit Diagnoses    Annual physical exam    -  Primary   Relevant Orders   Hemoglobin A1c   CBC with Differential/Platelet   COMPLETE METABOLIC PANEL WITH GFR   Lipid panel   TSH   Tachycardia       Relevant Orders   TSH   Abnormal glucose  Relevant Orders   Hemoglobin A1c   Encounter for screening mammogram for malignant neoplasm of breast       Relevant Orders   MM DIGITAL SCREENING BILATERAL     Updated Health Maintenance information  Due for routine colon cancer screening. Never had colonoscopy (not interested), no family history colon cancer. - Discussion today about recommendations for either Colonoscopy or Cologuard screening, benefits and risks of screening, interested in Cologuard, understands that if positive then recommendation is for diagnostic colonoscopy to follow-up. - Patient advised to contact insurance first to learn cost, will notify us when ready for [place] to order Cologuard  Ordered Mammogram  Recommend GYN Referral for pap  Labs ordered for next week fasting.  Encouraged improvement to lifestyle with diet and exercise - Goal of weight loss   Orders Placed This Encounter  Procedures  . MM DIGITAL SCREENING BILATERAL    Standing Status:   Future    Standing Expiration Date:   07/16/2020    Order Specific Question:   Reason for Exam (SYMPTOM  OR DIAGNOSIS REQUIRED)    Answer:   Routine screening bilateral mammogram. May change to Bilateral MM Tomo screening if indicated and appropriate for patient/insurance    Order Specific Question:   Preferred imaging location?    Answer:   [facility]  . Hemoglobin A1c    Standing Status:   Future    Standing Expiration Date:   03/31/2020  . CBC with Differential/Platelet    Standing Status:   Future    Standing Expiration Date:    03/31/2020  . COMPLETE METABOLIC PANEL WITH GFR    Standing Status:   Future    Standing Expiration Date:   03/31/2020  . Lipid panel    Standing Status:   Future    Standing Expiration Date:   03/31/2020    Order Specific Question:   Has the patient fasted?    Answer:   Yes  . TSH    Standing Status:   Future    Standing Expiration Date:   03/31/2020      No orders of the defined types were placed in this encounter.    Follow up plan: Return in about 3 months (around 04/15/2020) for 3 month follow-up HTN, Weight updates.   Saralyn Pilar, DO [facility] 01/16/2020, 2:54 PM

## 2020-01-16 NOTE — Patient Instructions (Addendum)
Thank you for coming to the office today.  For Mammogram screening for breast cancer   Call the [place] below anytime to schedule your own appointment now that order has been placed.  [facility], [place] [address] Phone: [phone]  -------------------  [facility] ready - let me know can refer to GYN for pap smear.  [facility] [address], [place], [place] [address] Hours: [place] Main: [place]   Address: [address], [place], [place], [place], [place] [address] Hours: 8AM-5PM Phone: [phone]  --------------------------------------   Colon Cancer Screening: - For all adults age 85+ routine colon cancer screening is highly recommended.     - Recent guidelines from [place] recommend starting age of 36 - Early detection of colon cancer is important, because often there are no warning signs or symptoms, also if found early usually it can be cured. Late stage is hard to treat.  - If you are not interested in Colonoscopy screening (if done and normal you could be cleared for 5 to 10 years until next due), then Cologuard is an excellent alternative for screening test for Colon Cancer. It is highly sensitive for detecting DNA of colon cancer from even the earliest stages. Also, there is NO bowel prep required. - If Cologuard is NEGATIVE, then it is good for 3 years before next due - If Cologuard is POSITIVE, then it is strongly advised to get a Colonoscopy, which allows the GI doctor to locate the source of the cancer or polyp (even very early stage) and treat it by removing it. ------------------------- If you would like to proceed with Cologuard (stool DNA test) - FIRST, call your insurance company and tell them you want to check cost of Cologuard tell them CPT Code [phone] (it may be completely covered and you could get  for no cost, OR max cost without any coverage is about $600). Also, keep in mind if you do NOT open the kit, and decide not to do the test, you will NOT be charged, you should contact the company if you decide not to do the test. - If you want to proceed, you can notify us (phone message, [place], or at next visit) and we will order it for you. The test kit will be delivered to you house within about 1 week. Follow instructions to collect sample, you may call the company for any help or questions, 24/7 telephone support at [phone].    Please schedule a Follow-up Appointment to: Return in about 3 months (around 04/15/2020) for 3 month follow-up HTN, Weight updates.  If you have any other questions or concerns, please feel free to call the office or send a message through [place]. You may also schedule an earlier appointment if necessary.  Additionally, you may be receiving a survey about your experience at our office within a few days to 1 week by e-mail or mail. We value your feedback.  Nobie Putnam, DO [place]

## 2020-01-16 NOTE — Assessment & Plan Note (Signed)
Mildly elevated initial BP, repeat manual check improved. - Home BP readings none No known complications    Plan:  1. Discuss options considered resume low dose monotherapy BP med in past lisinopril - agree hold off on med, improve lifestyle, reduce caffeine, wt loss first as trial 2. Encourage improved lifestyle - low sodium diet, regular exercise 3. Start monitor BP outside office, bring readings to next visit, if persistently >140/90 or new symptoms notify office sooner

## 2020-01-17 ENCOUNTER — Other Ambulatory Visit: Payer: Self-pay | Admitting: *Deleted

## 2020-01-17 DIAGNOSIS — R000 Tachycardia, unspecified: Secondary | ICD-10-CM

## 2020-01-17 DIAGNOSIS — R7309 Other abnormal glucose: Secondary | ICD-10-CM

## 2020-01-17 DIAGNOSIS — Z0000 Encounter for general adult medical examination without abnormal findings: Secondary | ICD-10-CM

## 2020-01-17 DIAGNOSIS — I10 Essential (primary) hypertension: Secondary | ICD-10-CM

## 2020-01-20 ENCOUNTER — Other Ambulatory Visit: Payer: Self-pay

## 2020-01-21 ENCOUNTER — Telehealth: Payer: Self-pay

## 2020-01-21 LAB — COMPLETE METABOLIC PANEL WITH GFR
AG Ratio: 2.3 (calc) (ref 1.0–2.5)
ALT: 12 U/L (ref 6–29)
AST: 12 U/L (ref 10–35)
Albumin: 4.5 g/dL (ref 3.6–5.1)
Alkaline phosphatase (APISO): 61 U/L (ref 37–153)
BUN: 13 mg/dL (ref 7–25)
CO2: 24 mmol/L (ref 20–32)
Calcium: 9.7 mg/dL (ref 8.6–10.4)
Chloride: 109 mmol/L (ref 98–110)
Creat: 0.74 mg/dL (ref 0.50–1.05)
GFR, Est African American: 106 mL/min/{1.73_m2} (ref >=60)
GFR, Est Non African American: 92 mL/min/{1.73_m2} (ref >=60)
Globulin: 2 g/dL (calc) (ref 1.9–3.7)
Glucose, Bld: 93 mg/dL (ref 65–99)
Potassium: 4.3 mmol/L (ref 3.5–5.3)
Sodium: 142 mmol/L (ref 135–146)
Total Bilirubin: 0.5 mg/dL (ref 0.2–1.2)
Total Protein: 6.5 g/dL (ref 6.1–8.1)

## 2020-01-21 LAB — CBC WITH DIFFERENTIAL/PLATELET
Absolute Monocytes: 315 cells/uL (ref 200–950)
Basophils Absolute: 40 cells/uL (ref 0–200)
Basophils Relative: 0.6 %
Eosinophils Absolute: 161 cells/uL (ref 15–500)
Eosinophils Relative: 2.4 %
HCT: 46.3 % — ABNORMAL HIGH (ref 35.0–45.0)
Hemoglobin: 15.4 g/dL (ref 11.7–15.5)
Lymphs Abs: 2285 cells/uL (ref 850–3900)
MCH: 29.7 pg (ref 27.0–33.0)
MCHC: 33.3 g/dL (ref 32.0–36.0)
MCV: 89.2 fL (ref 80.0–100.0)
MPV: 10.1 fL (ref 7.5–12.5)
Monocytes Relative: 4.7 %
Neutro Abs: 3899 cells/uL (ref 1500–7800)
Neutrophils Relative %: 58.2 %
Platelets: 268 10*3/uL (ref 140–400)
RBC: 5.19 10*6/uL — ABNORMAL HIGH (ref 3.80–5.10)
RDW: 12.9 % (ref 11.0–15.0)
Total Lymphocyte: 34.1 %
WBC: 6.7 10*3/uL (ref 3.8–10.8)

## 2020-01-21 LAB — TSH: TSH: 1.75 mIU/L

## 2020-01-21 LAB — LIPID PANEL
Cholesterol: 207 mg/dL — ABNORMAL HIGH (ref <200)
HDL: 68 mg/dL (ref >=50)
LDL Cholesterol (Calc): 123 mg/dL (calc) — ABNORMAL HIGH
Non-HDL Cholesterol (Calc): 139 mg/dL (calc) — ABNORMAL HIGH (ref <130)
Total CHOL/HDL Ratio: 3 (calc) (ref <5.0)
Triglycerides: 70 mg/dL (ref <150)

## 2020-01-21 LAB — HEMOGLOBIN A1C
Hgb A1c MFr Bld: 5.8 % of total Hgb — ABNORMAL HIGH (ref <5.7)
Mean Plasma Glucose: 120 mg/dL
eAG (mmol/L): 6.6 mmol/L

## 2020-01-21 NOTE — Telephone Encounter (Signed)
Pt would like to know if she could take a low dose steroid. This pt is having pain from the EPAT treatment.

## 2020-01-22 NOTE — Telephone Encounter (Signed)
"  I called and left a message on Monday and no one has called me back.  I also called today.  Do you all not have a nurse?"  Yes, the nurse has been assisting the doctors today.  She was not here on Monday.  My apologies.  How can I help you?  "I have been getting the shockwave treatments and afterwards,

## 2020-01-22 NOTE — Telephone Encounter (Signed)
Shirley Grant spoke to the patient and let her know to use Tylenol and her boot for alleviate some of her pain.

## 2020-01-29 ENCOUNTER — Other Ambulatory Visit: Payer: Self-pay

## 2020-01-29 ENCOUNTER — Ambulatory Visit (INDEPENDENT_AMBULATORY_CARE_PROVIDER_SITE_OTHER): Payer: Self-pay

## 2020-01-29 DIAGNOSIS — B351 Tinea unguium: Secondary | ICD-10-CM

## 2020-01-29 DIAGNOSIS — M722 Plantar fascial fibromatosis: Secondary | ICD-10-CM

## 2020-01-29 NOTE — Progress Notes (Signed)
Patient presents for the 2nd EPAT treatment today with complaint of plantar heel pain right. Diagnosed with plantar fasciitis by Dr. Logan Bores. This has been ongoing for several months. The patient has tried ice, stretching, NSAIDS and supportive shoe gear with no long term relief.   Most of the pain is located more plantar lateral heel RIGHT.  ESWT administered and tolerated well.Treatment settings initiated at:   Energy: 15  Ended treatment session today with 3000 shocks at the following settings:   Energy: 15  Frequency: 6.0  Joules: 14.72   Reviewed post EPAT instructions. Advised to avoid ice and NSAIDs throughout the treatment process and to utilize boot or supportive shoes for at least the next 3 days.  Follow up for 3rd treatment in 1 week.

## 2020-02-04 ENCOUNTER — Other Ambulatory Visit: Payer: Self-pay

## 2020-02-04 ENCOUNTER — Ambulatory Visit: Payer: No Typology Code available for payment source | Admitting: Podiatry

## 2020-02-04 DIAGNOSIS — M722 Plantar fascial fibromatosis: Secondary | ICD-10-CM

## 2020-02-04 MED ORDER — METHYLPREDNISOLONE 4 MG PO TBPK: ORAL_TABLET | ORAL | 0 refills | Status: DC

## 2020-02-18 ENCOUNTER — Other Ambulatory Visit: Payer: Self-pay

## 2020-02-18 ENCOUNTER — Ambulatory Visit
Admission: RE | Admit: 2020-02-18 | Discharge: 2020-02-18 | Disposition: A | Discharge status: Home / Self Care | Payer: No Typology Code available for payment source | Source: Ambulatory Visit | Attending: Family Medicine | Admitting: Family Medicine

## 2020-02-18 DIAGNOSIS — Z1231 Encounter for screening mammogram for malignant neoplasm of breast: Secondary | ICD-10-CM | POA: Insufficient documentation

## 2020-02-19 ENCOUNTER — Other Ambulatory Visit: Payer: Self-pay | Admitting: Family Medicine

## 2020-02-19 DIAGNOSIS — N6310 Unspecified lump in the right breast, unspecified quadrant: Secondary | ICD-10-CM

## 2020-02-19 DIAGNOSIS — R928 Other abnormal and inconclusive findings on diagnostic imaging of breast: Secondary | ICD-10-CM

## 2020-02-23 NOTE — Progress Notes (Signed)
   Subjective: 55 y.o. female presenting today for follow-up evaluation regarding plantar fasciitis to the right foot.  Patient states that since last visit she began to initiate EPAT therapy.  Patient went to 2 EPAT therapy appointments, and the third appointment unfortunately was rescheduled 3 different times due to office staffing concerns.  The patient is very frustrated today that she initiated new EPAT however was not able to undergo the scheduled EPAT therapy sessions.  Her last EPAT session was 01/29/2020.  Next EPAT session is scheduled for 02/24/2020.  The patient states that she continues to have pain and tenderness associated with plantar fasciitis of the right foot.  She cannot wait until 02/24/2020 to alleviate some of her pain.  She would like a steroid injection today.  She presents for further treatment and evaluation  Past Medical History:  Diagnosis Date  . Allergy   . Anxiety   . Depression      Objective: Physical Exam General: The patient is alert and oriented x3 in no acute distress.  Dermatology: Skin is warm, dry and supple bilateral lower extremities. Negative for open lesions or macerations bilateral.  There is a hyperkeratotic callus noted to the lateral aspect of the left foot with a central nucleated core consistent with a porokeratosis  Vascular: Dorsalis Pedis and Posterior Tibial pulses palpable bilateral.  Capillary fill time is immediate to all digits.  Neurological: Epicritic and protective threshold intact bilateral.   Musculoskeletal: Tenderness to palpation to the plantar aspect of the right heel along the plantar fascia. All other joints range of motion within normal limits bilateral. Strength 5/5 in all groups bilateral.   Assessment: 1. Plantar fasciitis right 2.  Porokeratosis left lateral foot  Plan of Care:  1. Patient evaluated.   2. Injection of 0.5cc Celestone soluspan injected into the right plantar fascia  3. Continue using plantar  fascial brace.  4.  Prescription for Medrol Dosepak.  Refill prescription for nabumetone 750 mg 2 times daily to begin taking after completion of the Dosepak 5. in regards to the EPAT sessions, we will resume EPAT therapy in 6 weeks.  Appointment with RN for EPAT therapy was scheduled today for 6 weeks from today's visit.  Unfortunately the patient paid for 2 sessions of EPAT and the third appointment was rescheduled multiple times.  We will see if we can apply the first 2 pain appointments to the next 2 appointments beginning in 6 weeks. 6.  Return to clinic in 6 weeks for first EPAT session with RN in the [place] office  Felecia Shelling, DPM [facility]  Dr. Felecia Shelling, DPM    [address] [place], [place] [address]                Office [phone]  Fax [phone]

## 2020-02-24 ENCOUNTER — Other Ambulatory Visit: Payer: Self-pay

## 2020-02-27 ENCOUNTER — Other Ambulatory Visit: Payer: Self-pay

## 2020-02-27 ENCOUNTER — Ambulatory Visit
Admission: RE | Admit: 2020-02-27 | Discharge: 2020-02-27 | Disposition: A | Discharge status: Home / Self Care | Payer: No Typology Code available for payment source | Source: Ambulatory Visit | Attending: Family Medicine | Admitting: Family Medicine

## 2020-02-27 DIAGNOSIS — R928 Other abnormal and inconclusive findings on diagnostic imaging of breast: Secondary | ICD-10-CM | POA: Insufficient documentation

## 2020-02-27 DIAGNOSIS — N6310 Unspecified lump in the right breast, unspecified quadrant: Secondary | ICD-10-CM | POA: Diagnosis present

## 2020-03-17 ENCOUNTER — Ambulatory Visit (INDEPENDENT_AMBULATORY_CARE_PROVIDER_SITE_OTHER): Payer: No Typology Code available for payment source | Admitting: *Deleted

## 2020-03-17 ENCOUNTER — Other Ambulatory Visit: Payer: Self-pay

## 2020-03-17 DIAGNOSIS — M722 Plantar fascial fibromatosis: Secondary | ICD-10-CM

## 2020-03-17 NOTE — Progress Notes (Signed)
Patient presents for the 3rd EPAT treatment today with complaint of plantar heel pain right. Diagnosed with plantar fasciitis by Dr. Logan Bores. This has been ongoing for several months. The patient has tried ice, stretching, NSAIDS and supportive shoe gear with no long term relief.   Most of the pain is located more plantar lateral heel RIGHT. Patient states she is not improved any. Our office had to reschedule her last appointment, so she feels her skip in treatment affected her. She did see Dr. Logan Bores and get an injection and a prednisone pack.   ESWT administered and tolerated well.Treatment settings initiated at:   Energy: 15  Ended treatment session today with 3000 shocks at the following settings:   Energy: 15  Frequency: 6.0  Joules: 14.72   Reviewed post EPAT instructions. Advised to avoid ice and NSAIDs throughout the treatment process and to utilize boot or supportive shoes for at least the next 3 days.  Follow up for 4th treatment in 1 week. We will do a few extra treatments since she missed a few weeks in between her last.

## 2020-03-24 ENCOUNTER — Other Ambulatory Visit: Payer: Self-pay

## 2020-03-24 ENCOUNTER — Ambulatory Visit (INDEPENDENT_AMBULATORY_CARE_PROVIDER_SITE_OTHER): Payer: No Typology Code available for payment source | Admitting: *Deleted

## 2020-03-24 DIAGNOSIS — M722 Plantar fascial fibromatosis: Secondary | ICD-10-CM

## 2020-03-24 NOTE — Progress Notes (Signed)
Patient presents for the 4th EPAT treatment today with complaint of plantar heel pain right. Diagnosed with plantar fasciitis by Dr. Logan Bores. This has been ongoing for several months. The patient has tried ice, stretching, NSAIDS and supportive shoe gear with no long term relief.   Most of the pain is located more plantar lateral heel RIGHT. Patient states she is still having quite a bit of pain.  Our office had to reschedule her last appointment, so she feels her skip in treatment affected her. She did see Dr. Logan Bores and get an injection and a prednisone pack.   ESWT administered and tolerated well.Treatment settings initiated at:   Energy: 20  Ended treatment session today with 3000 shocks at the following settings:   Energy: 20  Frequency: 5.0  Joules: 19.62   Reviewed post EPAT instructions. Advised to avoid ice and NSAIDs throughout the treatment process and to utilize boot or supportive shoes for at least the next 3 days.  Follow up for 5th treatment in 1 week and one more to follow in 2 weeks.

## 2020-03-31 ENCOUNTER — Other Ambulatory Visit: Payer: Self-pay

## 2020-03-31 ENCOUNTER — Ambulatory Visit (INDEPENDENT_AMBULATORY_CARE_PROVIDER_SITE_OTHER): Payer: No Typology Code available for payment source | Admitting: *Deleted

## 2020-03-31 DIAGNOSIS — M722 Plantar fascial fibromatosis: Secondary | ICD-10-CM

## 2020-03-31 NOTE — Progress Notes (Signed)
Patient presents for the 5th EPAT treatment today with complaint of plantar heel pain right. Diagnosed with plantar fasciitis by Dr. Logan Bores. This has been ongoing for several months. The patient has tried ice, stretching, NSAIDS and supportive shoe gear with no long term relief.   Most of the pain is located more plantar lateral heel RIGHT. Patient says she is not any better. She feels she keeps re-injuring it at work, has tried to wear the boot some.  Our office had to reschedule some of her appointments, so she feels her skip in treatment affected her. She did see Dr. Logan Bores and get an injection and a prednisone pack.   ESWT administered and tolerated well.Treatment settings initiated at:   Energy: 25  Ended treatment session today with 3000 shocks at the following settings:   Energy: 25  Frequency: 4.0  Joules: 24.52   Reviewed post EPAT instructions. Advised to avoid ice and NSAIDs throughout the treatment process and to utilize boot or supportive shoes for at least the next 3 days.  Follow up for 6th treatment in 2 weeks. She might call and cancel if she is still no better and just see Dr. Logan Bores instead. Advised she try and wear her boot as much as possible over the next 2 weeks.

## 2020-04-16 ENCOUNTER — Ambulatory Visit: Payer: Self-pay | Admitting: Family Medicine

## 2020-05-26 ENCOUNTER — Ambulatory Visit: Payer: No Typology Code available for payment source | Admitting: Podiatry

## 2020-05-26 ENCOUNTER — Other Ambulatory Visit: Payer: Self-pay

## 2020-05-26 DIAGNOSIS — M722 Plantar fascial fibromatosis: Secondary | ICD-10-CM

## 2020-05-26 MED ORDER — METHYLPREDNISOLONE 4 MG PO TBPK
ORAL_TABLET | ORAL | 0 refills | Status: DC
Start: 2020-05-26 — End: 2020-06-16

## 2020-05-26 MED ORDER — BETAMETHASONE SOD PHOS & ACET 6 (3-3) MG/ML IJ SUSP
3.0000 mg | Freq: Once | INTRAMUSCULAR | Status: AC
  Administered 2020-05-26: 3 mg via INTRA_ARTICULAR

## 2020-05-26 NOTE — Progress Notes (Signed)
   Subjective: 55 y.o. female for follow-up evaluation of plantar fasciitis to the right foot.  Patient states that she continues to have pain and tenderness.  She went through 3 sessions of EPAT therapy with no improvement.  She presents for further treatment evaluation   Past Medical History:  Diagnosis Date  . Allergy   . Anxiety   . Depression      Objective: Physical Exam General: The patient is alert and oriented x3 in no acute distress.  Dermatology: Skin is warm, dry and supple bilateral lower extremities. Negative for open lesions or macerations bilateral.   Vascular: Dorsalis Pedis and Posterior Tibial pulses palpable bilateral.  Capillary fill time is immediate to all digits.  Neurological: Epicritic and protective threshold intact bilateral.   Musculoskeletal: Tenderness to palpation to the plantar aspect of the right heel along the plantar fascia. All other joints range of motion within normal limits bilateral. Strength 5/5 in all groups bilateral.   Assessment: 1. Plantar fasciitis right  Plan of Care:  1. Patient evaluated. Xrays reviewed.   2. Injection of 0.5cc Celestone soluspan injected into the right plantar fascia  3. Rx for Medrol Dose Pack placed 4.  Continue Relafen as needed 5.  The patient did not get any improvement with the EPAT therapy.  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  We will continue conservative management for now 6.  Return to clinic as needed  Felecia Shelling, DPM [facility]  Dr. Felecia Shelling, DPM    [address] [place], [place] [address]                Office [phone]  Fax [phone]

## 2020-06-05 ENCOUNTER — Ambulatory Visit: Payer: No Typology Code available for payment source | Admitting: Podiatry

## 2020-06-16 ENCOUNTER — Ambulatory Visit: Payer: No Typology Code available for payment source | Admitting: Podiatry

## 2020-06-16 ENCOUNTER — Encounter: Payer: Self-pay | Admitting: Podiatry

## 2020-06-16 ENCOUNTER — Other Ambulatory Visit: Payer: Self-pay

## 2020-06-16 DIAGNOSIS — M722 Plantar fascial fibromatosis: Secondary | ICD-10-CM

## 2020-06-16 DIAGNOSIS — B070 Plantar wart: Secondary | ICD-10-CM | POA: Diagnosis not present

## 2020-06-16 NOTE — Progress Notes (Signed)
   Subjective: 55 y.o. female for a new complaint regarding symptomatic skin lesion to the plantar aspect of the right foot.  Patient states that her plantar fasciitis is actually feeling very well.  She states that the skin lesion is very symptomatic and she has been picking at it herself.  She presents for further treatment and evaluation   Past Medical History:  Diagnosis Date  . Allergy   . Anxiety   . Depression      Objective: Physical Exam General: The patient is alert and oriented x3 in no acute distress.  Dermatology: Skin is warm, dry and supple bilateral lower extremities. Negative for open lesions or macerations bilateral.  Hyperkeratotic skin lesion noted to the plantar aspect of the right foot with pinpoint bleeding upon debridement.  Vascular: Dorsalis Pedis and Posterior Tibial pulses palpable bilateral.  Capillary fill time is immediate to all digits.  Neurological: Epicritic and protective threshold intact bilateral.   Musculoskeletal: Today there is negative for any significant tenderness to palpation to the plantar aspect of the right heel along the plantar fascia. All other joints range of motion within normal limits bilateral. Strength 5/5 in all groups bilateral.   Assessment: 1. Plantar fasciitis right; mostly resolved at the moment 2. Plantar wart RT foot  Plan of Care:  1. Patient evaluated.  2. Continue Relafen as needed 3.  Excisional debridement of the plantar verruca lesion was performed using a 312 scalpel and Cantharone applied.  Light dressing applied.  Post care instructions provided. 4.  Return to clinic in 2 weeks  Felecia Shelling, DPM [facility]  Dr. Felecia Shelling, DPM    [address] [place], [place] [address]                Office [phone]  Fax [phone]

## 2020-06-30 ENCOUNTER — Other Ambulatory Visit: Payer: Self-pay

## 2020-06-30 ENCOUNTER — Ambulatory Visit: Payer: No Typology Code available for payment source | Admitting: Podiatry

## 2020-06-30 DIAGNOSIS — B070 Plantar wart: Secondary | ICD-10-CM | POA: Diagnosis not present

## 2020-06-30 NOTE — Progress Notes (Signed)
   Subjective: Patient presents today for follow-up evaluation of a plantar wart to the right lower extremity.  Last visit on 06/16/2020 Cantharone was applied.  Patient states that she is feeling much better today.  She no longer has pain in irritability when walking on the lesions.  Patient presents today for follow-up treatment and evaluation   Objective: Physical Exam General: The patient is alert and oriented x3 in no acute distress.   Dermatology: Hyperkeratotic skin lesion noted to the plantar aspect of the right foot approximately 1 cm in diameter.  Negative for pinpoint bleeding noted upon debridement. Skin is warm, dry and supple bilateral lower extremities. Negative for open lesions or macerations.   Vascular: Palpable pedal pulses bilaterally. No edema or erythema noted. Capillary refill within normal limits.   Neurological: Epicritic and protective threshold grossly intact bilaterally.    Musculoskeletal Exam:  No pedal deformities noted.  No pain with palpation of the verruca lesions   Assessment: #1 plantar wart right foot #2 history of plantar fasciitis right foot; mostly resolved at the moment     Plan of Care:  #1 Patient was evaluated. #2 light debridement of the plantar wart lesion was performed using a chisel blade. #3 recommend OTC wart remover x1-2 weeks #4 return to clinic as needed    Felecia Shelling, DPM [facility]  Dr. Felecia Shelling, DPM    [address]                                        [place], [place] [address]                Office [phone]  Fax [phone]

## 2020-10-21 ENCOUNTER — Ambulatory Visit: Payer: No Typology Code available for payment source | Admitting: Family Medicine

## 2020-10-21 ENCOUNTER — Encounter: Payer: Self-pay | Admitting: Family Medicine

## 2020-10-21 ENCOUNTER — Other Ambulatory Visit: Payer: Self-pay

## 2020-10-21 VITALS — BP 144/85 | HR 116 | Ht 66.0 in | Wt 203.8 lb

## 2020-10-21 DIAGNOSIS — I10 Essential (primary) hypertension: Secondary | ICD-10-CM | POA: Diagnosis not present

## 2020-10-21 DIAGNOSIS — E669 Obesity, unspecified: Secondary | ICD-10-CM

## 2020-10-21 DIAGNOSIS — G4700 Insomnia, unspecified: Secondary | ICD-10-CM

## 2020-10-21 DIAGNOSIS — F1021 Alcohol dependence, in remission: Secondary | ICD-10-CM

## 2020-10-21 NOTE — Patient Instructions (Addendum)
Thank you for coming to the office today.  Keep on Flonase, Benadryl  Follow-up 6 weeks with BP readings, check at home, log readings, goal BP < 140/90, and we may need to adjust with new med beta blocker to lower BP and heart rate or we can review again.  Please schedule a Follow-up Appointment to: Return in about 6 weeks (around 12/02/2020) for 6 week follow-up HTN, Insomnia.  If you have any other questions or concerns, please feel free to call the office or send a message through MyChart. You may also schedule an earlier appointment if necessary.  Additionally, you may be receiving a survey about your experience at our office within a few days to 1 week by e-mail or mail. We value your feedback.  Saralyn Pilar, DO [facility], [place]

## 2020-10-21 NOTE — Progress Notes (Signed)
Subjective:    Patient ID: Shirley Grant, female    DOB: 1966/01/04, 55 y.o.   MRN: [id number]  Shirley Grant is a 55 y.o. female presenting on 10/21/2020 for Hypertension   HPI  CHRONIC HTN Overweight BMI >29 Reports not checking. Previously on Lisinopril 5mg  in past. Current Meds - None. Lifestyle: Weight gain - Diet: admits eating sweets, caffeine 5mg  Works full time. Stress with caring for mother age 68. She is primary caregiver. Stress eating Nocturia - waking up overnight often.  Seasonal Allergies Not taking Zyrtec or Claritin. Takes Benadryl PRN at night for sleep and allergies. Tolerates well without problem. Takes Flonase  Alcohol dependence in remission No alcohol in past 3.5 years   Depression screen [facility] 2/9 10/21/2020 01/16/2020 08/01/2018  Decreased Interest 0 0 0  Down, Depressed, Hopeless 0 0 0  PHQ - 2 Score 0 0 0  Altered sleeping 2 - -  Tired, decreased energy 2 - -  Change in appetite 2 - -  Feeling bad or failure about yourself  0 - -  Trouble concentrating 0 - -  Moving slowly or fidgety/restless 0 - -  Suicidal thoughts 0 - -  PHQ-9 Score 6 - -  Difficult doing work/chores Not difficult at all - -    Social History   Tobacco Use   Smoking status: Every Day    Packs/day: 0.50    Years: 32.00    Pack years: 16.00    Types: Cigarettes   Smokeless tobacco: Never  Vaping Use   Vaping Use: Never used  Substance Use Topics   Alcohol use: No   Drug use: Never    Review of Systems Per HPI unless specifically indicated above     Objective:    BP (!) 144/85   Pulse (!) 116   Ht 5\' 6"  (1.676 m)   Wt 203 lb 12.8 oz (92.4 kg)   SpO2 97%   BMI 32.89 kg/m   Wt Readings from Last 3 Encounters:  10/21/20 203 lb 12.8 oz (92.4 kg)  01/16/20 184 lb (83.5 kg)  08/01/18 192 lb 9.6 oz (87.4 kg)    Physical Exam Vitals and nursing note reviewed.  Constitutional:      General: She is not in acute distress.    Appearance: She is  well-developed. She is not diaphoretic.     Comments: Well-appearing, comfortable, cooperative  HENT:     Head: Normocephalic and atraumatic.  Eyes:     General:        Right eye: No discharge.        Left eye: No discharge.     Conjunctiva/sclera: Conjunctivae normal.  Neck:     Thyroid: No thyromegaly.  Cardiovascular:     Rate and Rhythm: Regular rhythm. Tachycardia present.     Heart sounds: Normal heart sounds. No murmur heard. Pulmonary:     Effort: Pulmonary effort is normal. No respiratory distress.     Breath sounds: Normal breath sounds. No wheezing or rales.  Musculoskeletal:        General: Normal range of motion.     Cervical back: Normal range of motion and neck supple.  Lymphadenopathy:     Cervical: No cervical adenopathy.  Skin:    General: Skin is warm and dry.     Findings: No erythema or rash.  Neurological:     Mental Status: She is alert and oriented to person, place, and time.  Psychiatric:  Behavior: Behavior normal.     Comments: Well groomed, good eye contact, normal speech and thoughts     Results for orders placed or performed in visit on 01/17/20  TSH  Result Value Ref Range   TSH 1.75 mIU/L  Lipid panel  Result Value Ref Range   Cholesterol 207 (H) <200 mg/dL   HDL 68 > OR = 50 mg/dL   Triglycerides 70 <366 mg/dL   LDL Cholesterol (Calc) 123 (H) mg/dL (calc)   Total CHOL/HDL Ratio 3.0 <5.0 (calc)   Non-HDL Cholesterol (Calc) 139 (H) <130 mg/dL (calc)  COMPLETE METABOLIC PANEL WITH GFR  Result Value Ref Range   Glucose, Bld 93 65 - 99 mg/dL   BUN 13 7 - 25 mg/dL   Creat 2.94 7.65 - 4.65 mg/dL   GFR, Est Non African American 92 > OR = 60 mL/min/1.41m2   GFR, Est African American 106 > OR = 60 mL/min/1.56m2   BUN/Creatinine Ratio NOT APPLICABLE 6 - 22 (calc)   Sodium 142 135 - 146 mmol/L   Potassium 4.3 3.5 - 5.3 mmol/L   Chloride 109 98 - 110 mmol/L   CO2 24 20 - 32 mmol/L   Calcium 9.7 8.6 - 10.4 mg/dL   Total Protein 6.5  6.1 - 8.1 g/dL   Albumin 4.5 3.6 - 5.1 g/dL   Globulin 2.0 1.9 - 3.7 g/dL (calc)   AG Ratio 2.3 1.0 - 2.5 (calc)   Total Bilirubin 0.5 0.2 - 1.2 mg/dL   Alkaline phosphatase (APISO) 61 37 - 153 U/L   AST 12 10 - 35 U/L   ALT 12 6 - 29 U/L  CBC with Differential/Platelet  Result Value Ref Range   WBC 6.7 3.8 - 10.8 Thousand/uL   RBC 5.19 (H) 3.80 - 5.10 Million/uL   Hemoglobin 15.4 11.7 - 15.5 g/dL   HCT 03.5 (H) 46.5 - 68.1 %   MCV 89.2 80.0 - 100.0 fL   MCH 29.7 27.0 - 33.0 pg   MCHC 33.3 32.0 - 36.0 g/dL   RDW 27.5 17.0 - 01.7 %   Platelets 268 140 - 400 Thousand/uL   MPV 10.1 7.5 - 12.5 fL   Neutro Abs 3,899 1,500 - 7,800 cells/uL   Lymphs Abs 2,285 850 - 3,900 cells/uL   Absolute Monocytes 315 200 - 950 cells/uL   Eosinophils Absolute 161 15 - 500 cells/uL   Basophils Absolute 40 0 - 200 cells/uL   Neutrophils Relative % 58.2 %   Total Lymphocyte 34.1 %   Monocytes Relative 4.7 %   Eosinophils Relative 2.4 %   Basophils Relative 0.6 %  Hemoglobin A1c  Result Value Ref Range   Hgb A1c MFr Bld 5.8 (H) <5.7 % of total Hgb   Mean Plasma Glucose 120 mg/dL   eAG (mmol/L) 6.6 mmol/L      Assessment & Plan:   Problem List Items Addressed This Visit     Obesity (BMI 30-39.9)   Insomnia   Essential hypertension - Primary   Alcohol dependence in remission (HCC)    Alcohol dependence in remission - without alcohol intake 3.5 years  HTN Mild elevated, some acute stressors, and lack of home BP checks Check BP at home write down log, f/u closely and will discuss medication management if indicated. Consider BB with Tachycardia  Insomnia Chronic problem May take benadryl PRN Consider other options going forward if indicated.  Completed form - okay to have Flonase, Benadryl  No orders of the defined types were  placed in this encounter.     Follow up plan: Return in about 6 weeks (around 12/02/2020) for 6 week follow-up HTN, Insomnia.   Saralyn Pilar,  DO [facility] 10/21/2020, 9:12 AM

## 2020-12-07 ENCOUNTER — Other Ambulatory Visit: Payer: Self-pay

## 2020-12-07 ENCOUNTER — Ambulatory Visit: Payer: No Typology Code available for payment source | Admitting: Family Medicine

## 2020-12-07 ENCOUNTER — Ambulatory Visit: Payer: Self-pay

## 2020-12-07 ENCOUNTER — Encounter: Payer: Self-pay | Admitting: Family Medicine

## 2020-12-07 VITALS — BP 177/94 | HR 111 | Ht 66.0 in | Wt 206.6 lb

## 2020-12-07 DIAGNOSIS — N3001 Acute cystitis with hematuria: Secondary | ICD-10-CM | POA: Diagnosis not present

## 2020-12-07 DIAGNOSIS — R310 Gross hematuria: Secondary | ICD-10-CM

## 2020-12-07 DIAGNOSIS — N3011 Interstitial cystitis (chronic) with hematuria: Secondary | ICD-10-CM

## 2020-12-07 LAB — POCT URINALYSIS DIPSTICK
Bilirubin, UA: NEGATIVE
Glucose, UA: NEGATIVE
Ketones, UA: NEGATIVE
Nitrite, UA: POSITIVE
Protein, UA: POSITIVE — AB
Spec Grav, UA: 1.015 (ref 1.010–1.025)
Urobilinogen, UA: 0.2 E.U./dL
pH, UA: 5 (ref 5.0–8.0)

## 2020-12-07 MED ORDER — CIPROFLOXACIN HCL 500 MG PO TABS: 500.0000 mg | ORAL_TABLET | Freq: Two times a day (BID) | ORAL | 0 refills | Status: DC

## 2020-12-07 NOTE — Progress Notes (Addendum)
Subjective:    Patient ID: Shirley Grant, female    DOB: Feb 10, 1965, 55 y.o.   MRN: [id number]  Shirley Grant is a 55 y.o. female presenting on 12/07/2020 for Hematuria   HPI  Acute UTI cystitis with hematuria Gross Hematuria History of Interstitial Cystitis  Reports onset symptoms for past few weeks, with dysuria and passing blood in urine. Now passing larger clots, some urinary discomfort, burning. Has had UTI before similar, has history of reported interstitial cystitis similar history, treated by Urology in [facility], now they are retired. She is here to request antibiotics today to treat. Denies fever chills nausea vomiting   Depression screen [facility] 2/9 10/21/2020 01/16/2020 08/01/2018  Decreased Interest 0 0 0  Down, Depressed, Hopeless 0 0 0  PHQ - 2 Score 0 0 0  Altered sleeping 2 - -  Tired, decreased energy 2 - -  Change in appetite 2 - -  Feeling bad or failure about yourself  0 - -  Trouble concentrating 0 - -  Moving slowly or fidgety/restless 0 - -  Suicidal thoughts 0 - -  PHQ-9 Score 6 - -  Difficult doing work/chores Not difficult at all - -    Social History   Tobacco Use   Smoking status: Every Day    Packs/day: 0.50    Years: 32.00    Pack years: 16.00    Types: Cigarettes   Smokeless tobacco: Never  Vaping Use   Vaping Use: Never used  Substance Use Topics   Alcohol use: No   Drug use: Never    Review of Systems Per HPI unless specifically indicated above     Objective:    BP (!) 177/94 (BP Location: Left Arm, Patient Position: Sitting, Cuff Size: Normal)   Pulse (!) 111   Ht 5\' 6"  (1.676 m)   Wt 206 lb 9.6 oz (93.7 kg)   BMI 33.35 kg/m   Wt Readings from Last 3 Encounters:  12/07/20 206 lb 9.6 oz (93.7 kg)  10/21/20 203 lb 12.8 oz (92.4 kg)  01/16/20 184 lb (83.5 kg)    Physical Exam Vitals and nursing note reviewed.  Constitutional:      General: She is not in acute distress.    Appearance: Normal appearance. She  is well-developed. She is not diaphoretic.     Comments: Well-appearing, comfortable, cooperative  HENT:     Head: Normocephalic and atraumatic.  Eyes:     General:        Right eye: No discharge.        Left eye: No discharge.     Conjunctiva/sclera: Conjunctivae normal.  Cardiovascular:     Rate and Rhythm: Normal rate.  Pulmonary:     Effort: Pulmonary effort is normal.  Skin:    General: Skin is warm and dry.     Findings: No erythema or rash.  Neurological:     Mental Status: She is alert and oriented to person, place, and time.  Psychiatric:        Mood and Affect: Mood normal.        Behavior: Behavior normal.        Thought Content: Thought content normal.     Comments: Well groomed, good eye contact, normal speech and thoughts   Results for orders placed or performed in visit on 12/07/20  POCT Urinalysis Dipstick  Result Value Ref Range   Color, UA Red    Clarity, UA Cloudy    Glucose, UA  Negative Negative   Bilirubin, UA Negative    Ketones, UA Negative    Spec Grav, UA 1.015 1.010 - 1.025   Blood, UA Large +++    pH, UA 5.0 5.0 - 8.0   Protein, UA Positive (A) Negative   Urobilinogen, UA 0.2 0.2 or 1.0 E.U./dL   Nitrite, UA Positive    Leukocytes, UA Large (3+) (A) Negative   Appearance     Odor        Assessment & Plan:   Problem List Items Addressed This Visit   None Visit Diagnoses     Gross hematuria    -  Primary   Relevant Orders   POCT Urinalysis Dipstick (Completed)   Urine Culture   Ambulatory referral to Urology   Acute cystitis with hematuria       Relevant Medications   ciprofloxacin (CIPRO) 500 MG tablet   Other Relevant Orders   Ambulatory referral to Urology   Interstitial cystitis (chronic) with hematuria           acute UTI with gross hematuria passing large blood and blood clots severe UTI and history of chronic interstitial cystitis.  No concern for pyelo today (no systemic symptoms, neg fever, back pain,  n/v).  Plan: UA Dipstick grossly positive 2. Ordered Urine culture 3. Cipro 500mg  BID x 7 days 4. Improve PO hydration  Return criteria if more severe when to go to hospital if indicated.  Referral to [facility].   Orders Placed This Encounter  Procedures   Urine Culture   Ambulatory referral to Urology    Referral Priority:   Routine    Referral Type:   Consultation    Referral Reason:   Specialty Services Required    Requested Specialty:   Urology    Number of Visits Requested:   1   POCT Urinalysis Dipstick     Meds ordered this encounter  Medications   ciprofloxacin (CIPRO) 500 MG tablet    Sig: Take 1 tablet (500 mg total) by mouth 2 (two) times daily.    Dispense:  14 tablet    Refill:  0      Follow up plan: Return if symptoms worsen or fail to improve.   , DO [facility] 12/07/2020, 9:38 AM

## 2020-12-07 NOTE — Telephone Encounter (Signed)
  Pt. Reports she started having urinary symptoms this morning - burning, frequency and blood in urine. Has chills. Appointment made.   Reason for Disposition  Urinating more frequently than usual (i.e., frequency)  Answer Assessment - Initial Assessment Questions 1. SYMPTOM: "What's the main symptom you're concerned about?" (e.g., frequency, incontinence)     Blood, burning, frequency 2. ONSET: "When did the  symptoms  start?"     Today 3. PAIN: "Is there any pain?" If Yes, ask: "How bad is it?" (Scale: 1-10; mild, moderate, severe)     Burning 4. CAUSE: "What do you think is causing the symptoms?"     UTI 5. OTHER SYMPTOMS: "Do you have any other symptoms?" (e.g., fever, flank pain, blood in urine, pain with urination)     Chills, burning 6. PREGNANCY: "Is there any chance you are pregnant?" "When was your last menstrual period?"     No  Protocols used: Urinary Symptoms-A-AH

## 2020-12-07 NOTE — Patient Instructions (Addendum)
[facility] -1st floor [address] [place],  [place]  [address] Phone: [phone]  Referral sent to Urology - because of blood, we need consultation from Urologist. To determine exactly where blood is coming from and if there is other cause.  1. You have a Urinary Tract Infection - this is very common, your symptoms are reassuring and you should get better within 1 week on the antibiotics - Start Cipro 500mg  2 times daily for next 7 days, complete entire course, even if feeling better - We sent urine for a culture, we will call you within next few days if we need to change antibiotics - Please drink plenty of fluids, improve hydration over next 1 week  If symptoms worsening, developing nausea / vomiting, worsening back pain, fevers / chills / sweats, then please return for re-evaluation sooner.  If you take AZO OTC - limit this to 2-3 days MAX to avoid affecting kidneys  D-Mannose is a natural supplement that can actually help bind to urinary bacteria and reduce their effectiveness it can help prevent UTI from forming, and may reduce some symptoms. It likely cannot cure an active UTI but it is worth a try and good to prevent them with. Try 500mg  twice a day at a full dose if you want, or check package instructions for more info   Please schedule a Follow-up Appointment to: Return if symptoms worsen or fail to improve.  If you have any other questions or concerns, please feel free to call the office or send a message through MyChart. You may also schedule an earlier appointment if necessary.  Additionally, you may be receiving a survey about your experience at our office within a few days to 1 week by e-mail or mail. We value your feedback.  , DO [facility], Saralyn Pilar

## 2020-12-09 LAB — URINE CULTURE
MICRO NUMBER:: 12603552
SPECIMEN QUALITY:: ADEQUATE

## 2020-12-10 ENCOUNTER — Ambulatory Visit: Payer: Self-pay | Admitting: *Deleted

## 2020-12-10 DIAGNOSIS — B379 Candidiasis, unspecified: Secondary | ICD-10-CM

## 2020-12-10 MED ORDER — FLUCONAZOLE 150 MG PO TABS: ORAL_TABLET | ORAL | 1 refills | Status: DC

## 2020-12-10 NOTE — Telephone Encounter (Signed)
Ok. Will send rx Diflucan to [facility]  Saralyn Pilar, DO [facility] 12/10/2020, 12:12 PM

## 2020-12-10 NOTE — Telephone Encounter (Signed)
Pt reports has been on Cipro for 3 days for UTI. Reports vaginal itching. No discharge. Reports has had in past and has taken Diflucan. Requesting med called in to [facility]. Declines appt. Assured pt NT would route to practice for PCPs review and final disposition. Pt verbalizes understanding.  Please advise

## 2020-12-10 NOTE — Telephone Encounter (Signed)
Pt is treating a UTI and feels like a yeast infection is developing, wants Diflucan called in to [facility]. Just saw PCP does not want appt    Reason for Disposition  [1] Symptoms of a yeast infection (i.e., itchy, white discharge, not bad smelling) AND [2] feels like prior vaginal yeast infections  Answer Assessment - Initial Assessment Questions 1. SYMPTOM: "What's the main symptom you're concerned about?" (e.g., pain, itching, dryness)     Itching 2. LOCATION: "Where is the  *No Answer* located?" (e.g., inside/outside, left/right)     VAginal itching 3. ONSET: "When did the  *No Answer*  start?"      today 4. PAIN: "Is there any pain?" If Yes, ask: "How bad is it?" (Scale: 1-10; mild, moderate, severe)     no 5. ITCHING: "Is there any itching?" If Yes, ask: "How bad is it?" (Scale: 1-10; mild, moderate, severe)      mpderate 6. CAUSE: "What do you think is causing the discharge?" "Have you had the same problem before? What happened then?"     No discharge 7. OTHER SYMPTOMS: "Do you have any other symptoms?" (e.g., fever, itching, vaginal bleeding, pain with urination, injury to genital area, vaginal foreign body)     no  Protocols used: Vaginal Symptoms-A-AH

## 2021-01-18 ENCOUNTER — Ambulatory Visit: Payer: No Typology Code available for payment source | Admitting: Urology

## 2021-04-26 ENCOUNTER — Encounter: Payer: Self-pay | Admitting: Physician Assistant

## 2021-04-26 ENCOUNTER — Other Ambulatory Visit: Payer: Self-pay

## 2021-04-26 ENCOUNTER — Ambulatory Visit: Payer: No Typology Code available for payment source | Admitting: Physician Assistant

## 2021-04-26 VITALS — BP 148/92 | HR 101 | Temp 97.5°F | Wt 214.0 lb

## 2021-04-26 DIAGNOSIS — R350 Frequency of micturition: Secondary | ICD-10-CM | POA: Diagnosis not present

## 2021-04-26 LAB — POCT URINALYSIS DIPSTICK
Bilirubin, UA: NEGATIVE
Blood, UA: NEGATIVE
Glucose, UA: NEGATIVE
Ketones, UA: NEGATIVE
Leukocytes, UA: NEGATIVE
Nitrite, UA: NEGATIVE
Protein, UA: NEGATIVE
Spec Grav, UA: <=1.005 — AB (ref 1.010–1.025)
Urobilinogen, UA: 0.2 E.U./dL
pH, UA: 7 (ref 5.0–8.0)

## 2021-04-26 NOTE — Patient Instructions (Signed)
Your urinalysis was overall benign but I sending a sample off for a urine culture.  ?We will keep you updated on those results and if there are further interventions we need to implement ? ?Continue to stay well hydrated and try reducing caffeine to see if that improves the urinary frequency.  ? ?

## 2021-04-26 NOTE — Progress Notes (Signed)
? ? ?  ?    Acute Office Visit ? ? ?Patient: Shirley Grant   DOB: January 10, 1966   56 y.o. Female  MRN: [id number] ?Visit Date: 04/26/2021 ? ?Today's healthcare provider: Oswaldo Conroy Ural Acree, PA-C  ?Introduced myself to the patient as a Secondary school teacher and provided education on APPs in clinical practice.  ? ? ?Chief Complaint  ?Patient presents with  ? Urinary Tract Infection  ? ?Subjective  ?  ?HPI  ? ?States she noticed on Sat that she was having urinary frequency  ?Denies dysuria ?States she has a history of interstitial cystitis and this can be difficult to differentiate between flare and UTI ?UA results were discussed with patient  ? ? ? ?Medications: ?Outpatient Medications Prior to Visit  ?Medication Sig  ? acetaminophen (TYLENOL) 500 MG tablet Take 1,000 mg by mouth every 6 (six) hours as needed.  ? cetirizine (ZYRTEC) 10 MG tablet Take 10 mg by mouth as needed for allergies.  ? diphenhydrAMINE (BENADRYL) 50 MG tablet Take 50 mg by mouth at bedtime as needed for itching.  ? fluticasone (FLONASE) 50 MCG/ACT nasal spray Place 1 spray into both nostrils daily as needed for allergies or rhinitis.  ? ibuprofen (ADVIL) 200 MG tablet Take 800 mg by mouth every 6 (six) hours as needed.  ? ciprofloxacin (CIPRO) 500 MG tablet Take 1 tablet (500 mg total) by mouth 2 (two) times daily.  ? diclofenac (VOLTAREN) 75 MG EC tablet Take 1 tablet (75 mg total) by mouth 2 (two) times daily.  ? fluconazole (DIFLUCAN) 150 MG tablet Take one tablet by mouth on Day 1. Repeat dose 2nd tablet on Day 3.  ? nabumetone (RELAFEN) 750 MG tablet Take 1 tablet (750 mg total) by mouth 2 (two) times daily as needed.  ? ?No facility-administered medications prior to visit.  ? ? ?Review of Systems  ?Genitourinary:  Positive for frequency. Negative for dysuria, hematuria and urgency.  ? ? ?  Objective  ?  ?BP (!) 148/92 (BP Location: Left Arm, Patient Position: Sitting, Cuff Size: Large)   Pulse (!) 101   Temp (!) 97.5 ?F (36.4 ?C) (Temporal)   Wt 214 lb (97.1  kg)   SpO2 98%   BMI 34.54 kg/m?  ? ? ?Physical Exam ?Vitals reviewed.  ?Constitutional:   ?   General: She is awake.  ?   Appearance: Normal appearance. She is well-developed and well-groomed. She is obese.  ?HENT:  ?   Head: Normocephalic and atraumatic.  ?Cardiovascular:  ?   Rate and Rhythm: Normal rate and regular rhythm.  ?   Pulses: Normal pulses.     ?     Radial pulses are 2+ on the right side and 2+ on the left side.  ?   Heart sounds: Heart sounds are distant.  ?Pulmonary:  ?   Effort: Pulmonary effort is normal.  ?   Breath sounds: Normal breath sounds and air entry. No decreased breath sounds, wheezing, rhonchi or rales.  ?Abdominal:  ?   General: Abdomen is flat. Bowel sounds are normal.  ?   Palpations: Abdomen is soft.  ?   Tenderness: There is no abdominal tenderness.  ?Musculoskeletal:  ?   Right lower leg: No edema.  ?   Left lower leg: No edema.  ?Neurological:  ?   Mental Status: She is alert.  ?Psychiatric:     ?   Behavior: Behavior is cooperative.  ?  ? ? ?Results for orders placed or performed in  visit on 04/26/21  ?POCT urinalysis dipstick  ?Result Value Ref Range  ? Color, UA    ? Clarity, UA    ? Glucose, UA Negative Negative  ? Bilirubin, UA Negative   ? Ketones, UA Negative   ? Spec Grav, UA <=1.005 (A) 1.010 - 1.025  ? Blood, UA Negative   ? pH, UA 7.0 5.0 - 8.0  ? Protein, UA Negative Negative  ? Urobilinogen, UA 0.2 0.2 or 1.0 E.U./dL  ? Nitrite, UA Negative   ? Leukocytes, UA Negative Negative  ? Appearance    ? Odor    ? ? Assessment & Plan  ?  ? ?Problem List Items Addressed This Visit   ?None ?Visit Diagnoses   ? ? Urinary frequency    -  Primary ?Acute, new problem, stable ?Patient reports urinary frequency symptoms began on Sat  ?Denies associated symptoms of dysuria, incomplete voiding sensation, hematuria ?UA was reassuring but will send for urine culture to rule out infection ?Recommend she stay well hydrated, attempt to reduce caffeine intake  ?Results to dictate further  management as appropriate. ?  ? Relevant Orders  ? POCT urinalysis dipstick (Completed)  ? Urine Culture  ? ?  ? ? ? ?No follow-ups on file. ? ? ?I, Peni Rupard E Ronia Hazelett, PA-C, have reviewed all documentation for this visit. The documentation on 04/26/21 for the exam, diagnosis, procedures, and orders are all accurate and complete. ? ? ?Terriyah Westra, Mirian Mo MPH ?[facility] ?[facility] ? ? ? ?No follow-ups on file.  ?   ? ? ? ? ?

## 2021-04-27 LAB — URINE CULTURE
MICRO NUMBER:: 13184117
Result:: NO GROWTH
SPECIMEN QUALITY:: ADEQUATE

## 2021-07-29 ENCOUNTER — Encounter: Payer: Self-pay | Admitting: Internal Medicine

## 2021-07-29 ENCOUNTER — Ambulatory Visit: Payer: Self-pay | Admitting: *Deleted

## 2021-07-29 ENCOUNTER — Ambulatory Visit (INDEPENDENT_AMBULATORY_CARE_PROVIDER_SITE_OTHER): Payer: BC Managed Care – PPO | Admitting: Internal Medicine

## 2021-07-29 VITALS — BP 116/68 | HR 108 | Temp 96.4°F | Wt 205.0 lb

## 2021-07-29 DIAGNOSIS — L2082 Flexural eczema: Secondary | ICD-10-CM | POA: Diagnosis not present

## 2021-07-29 MED ORDER — PREDNISONE 10 MG PO TABS: ORAL_TABLET | ORAL | 0 refills | Status: DC

## 2021-07-29 NOTE — Patient Instructions (Signed)
Eczema Eczema refers to a group of skin conditions that cause skin to become rough and inflamed. Each type of eczema has different triggers, symptoms, and treatments. Eczema of any type is usually itchy. Symptoms range from mild to severe. Eczema is not spread from person to person (is not contagious). It can appear on different parts of the body at different times. One person's eczema may look different from another person's eczema. What are the causes? The exact cause of this condition is not known. However, exposure to certain environmental factors, irritants, and allergens can make the condition worse. What are the signs or symptoms? Symptoms of this condition depend on the type of eczema you have. The types include: Contact dermatitis. There are two kinds: Irritant contact dermatitis. This happens when something irritates the skin and causes a rash. Allergic contact dermatitis. This happens when your skin comes in contact with something you are allergic to (allergens). This can include poison ivy, chemicals, or medicines that were applied to your skin. Atopic dermatitis. This is a long-term (chronic) skin disease that keeps coming back (recurring). It is the most common type of eczema. Usual symptoms are a red rash and itchy, dry, scaly skin. It usually starts showing signs in infancy and can last through adulthood. Dyshidrotic eczema. This is a form of eczema on the hands and feet. It shows up as very itchy, fluid-filled blisters. It can affect people of any age but is more common before age 40. Hand eczema. This causes very itchy areas of skin on the palms and sides of the hands and fingers. This type of eczema is common in industrial jobs where you may be exposed to different types of irritants. Lichen simplex chronicus. This type of eczema occurs when a person constantly scratches one area of the body. Repeated scratching of the area leads to thickened skin (lichenification). This condition can  accompany other types of eczema. It is more common in adults but may also be seen in children. Nummular eczema. This is a common type of eczema that most often affects the lower legs and the backs of the hands. It typically causes an itchy, red, circular, crusty lesion (plaque). Scratching may become a habit and can cause bleeding. Nummular eczema occurs most often in middle-aged or older people. Seborrheic dermatitis. This is a common skin disease that mainly affects the scalp. It may also affect other oily areas of the body, such as the face, sides of the nose, eyebrows, ears, eyelids, and chest. It is marked by small scaling and redness of the skin (erythema). This can affect people of all ages. In infants, this condition is called cradle cap. Stasis dermatitis. This is a common skin disease that can cause itching, scaling, and hyperpigmentation, usually on the legs and feet. It occurs most often in people who have a condition that prevents blood from being pumped through the veins in the legs (chronic venous insufficiency). Stasis dermatitis is a chronic condition that needs long-term management. How is this diagnosed? This condition may be diagnosed based on: A physical exam of your skin. Your medical history. Skin patch tests. These tests involve using patches that contain possible allergens and placing them on your back. Your health care provider will check in a few days to see if an allergic reaction occurred. How is this treated? Treatment for eczema is based on the type of eczema you have. You may be given hydrocortisone steroid medicine or antihistamines. These can relieve itching quickly and help reduce inflammation.   These may be prescribed or purchased over the counter, depending on the strength that is needed. Follow these instructions at home: Take or apply over-the-counter and prescription medicines only as told by your health care provider. Use creams or ointments to moisturize your  skin. Do not use lotions. Learn what triggers or irritates your symptoms so you can avoid these things. Treat symptom flare-ups quickly. Do not scratch your skin. This can make your rash worse. Keep all follow-up visits. This is important. Where to find more information American Academy of Dermatology: aad.org National Eczema Association: nationaleczema.org The Society for Pediatric Dermatology: pedsderm.net Contact a health care provider if: You have severe itching, even with treatment. You scratch your skin regularly until it bleeds. Your rash looks different than usual. Your skin is painful, swollen, or more red than usual. You have a fever. Summary Eczema refers to a group of skin conditions that cause skin to become rough and inflamed. Each type has different triggers. Eczema of any type causes itching that may range from mild to severe. Treatment varies based on the type of eczema you have. Hydrocortisone steroid medicine or antihistamines can help with itching and inflammation. Protecting your skin is the best way to prevent eczema. Use creams or ointments to moisturize your skin. Avoid triggers and irritants. Treat flare-ups quickly. This information is not intended to replace advice given to you by your health care provider. Make sure you discuss any questions you have with your health care provider. Document Revised: 10/28/2019 Document Reviewed: 10/28/2019 Elsevier Patient Education  2023 Elsevier Inc.  

## 2021-07-29 NOTE — Telephone Encounter (Signed)
  Chief Complaint: rash to inner left arm requesting appt  Symptoms: red rash to left inner forearm spreading down arm. Localized to left arm. Severe itching , not sleeping due to itching. Has tried benadryl, hydrocotisone cream etc and not effective.  Frequency: 3 months  Pertinent Negatives: Patient denies fever, no raised rash no blistering or drainage Disposition: [] ED /[] Urgent Care (no appt availability in office) / [] Appointment(In office/virtual)/ []  [facility]/ [] Home Care/ [] Refused Recommended Disposition /[]  Mobile Bus/ [x]  Follow-up with PCP Additional Notes:   No available OV with PCP until 08/06/21. Patient would like to be seen today if possible due to starting work tomorrow through the weekend. Please contact patient if she can be seen by any provider today.    Reason for Disposition  [1] Severe localized itching AND [2] after 2 days of steroid cream  Answer Assessment - Initial Assessment Questions 1. APPEARANCE of RASH: "Describe the rash."      Red "looks like eczema"  2. LOCATION: "Where is the rash located?"      Left inner arm 3. NUMBER: "How many spots are there?"      Hyperpigmented in color where old spots were 4. SIZE: "How big are the spots?" (Inches, centimeters or compare to size of a coin)      Na  5. ONSET: "When did the rash start?"      3 months  6. ITCHING: "Does the rash itch?" If Yes, ask: "How bad is the itch?"  (Scale 0-10; or none, mild, moderate, severe)    Yes severe 7. PAIN: "Does the rash hurt?" If Yes, ask: "How bad is the pain?"  (Scale 0-10; or none, mild, moderate, severe)    - NONE (0): no pain    - MILD (1-3): doesn't interfere with normal activities     - MODERATE (4-7): interferes with normal activities or awakens from sleep     - SEVERE (8-10): excruciating pain, unable to do any normal activities     No pain but moderate to severe itching  8. OTHER SYMPTOMS: "Do you have any other symptoms?" (e.g., fever)      No  9. PREGNANCY: "Is there any chance you are pregnant?" "When was your last menstrual period?"     na  Protocols used: Rash or Redness - Localized-A-AH

## 2021-07-29 NOTE — Telephone Encounter (Signed)
Patient has been scheduled with Rene Kocher this afternoon at 3:40. She is aware.

## 2021-07-29 NOTE — Progress Notes (Signed)
Subjective:    Patient ID: Shirley Grant, female    DOB: 1965/02/22, 56 y.o.   MRN: [id number]  HPI  Pt presents to the clinic today with c/o a rash to her left upper extremity. She noticed this 3 months ago. The rash itches. It does seem to be spreading. She has tried Benadryl, Hydrocortisone cream,  Clobetasol, Alcohol, Tea Tree Oil and Prograf OTC with minimal relief of symptoms.  Review of Systems     Past Medical History:  Diagnosis Date   Allergy    Anxiety    Depression     Current Outpatient Medications  Medication Sig Dispense Refill   acetaminophen (TYLENOL) 500 MG tablet Take 1,000 mg by mouth every 6 (six) hours as needed.     cetirizine (ZYRTEC) 10 MG tablet Take 10 mg by mouth as needed for allergies.     diphenhydrAMINE (BENADRYL) 50 MG tablet Take 50 mg by mouth at bedtime as needed for itching.     fluticasone (FLONASE) 50 MCG/ACT nasal spray Place 1 spray into both nostrils daily as needed for allergies or rhinitis.     ibuprofen (ADVIL) 200 MG tablet Take 800 mg by mouth every 6 (six) hours as needed.     nabumetone (RELAFEN) 750 MG tablet Take 1 tablet (750 mg total) by mouth 2 (two) times daily as needed. 90 tablet 2   No current facility-administered medications for this visit.    Allergies  Allergen Reactions   Codeine     Family History  Problem Relation Age of Onset   Heart disease Mother    Congestive Heart Failure Mother    Heart disease Father    Hypertension Father    Drug abuse Father     Social History   Socioeconomic History   Marital status: Divorced    Spouse name: Not on file   Number of children: Not on file   Years of education: Not on file   Highest education level: Not on file  Occupational History   Not on file  Tobacco Use   Smoking status: Every Day    Packs/day: 0.50    Years: 32.00    Total pack years: 16.00    Types: Cigarettes   Smokeless tobacco: Never  Vaping Use   Vaping Use: Never used  Substance  and Sexual Activity   Alcohol use: No   Drug use: Never   Sexual activity: Yes    Birth control/protection: None  Other Topics Concern   Not on file  Social History Narrative   Not on file   Social Determinants of Health   Financial Resource Strain: Not on file  Food Insecurity: Not on file  Transportation Needs: Not on file  Physical Activity: Not on file  Stress: Not on file  Social Connections: Not on file  Intimate Partner Violence: Not on file     Constitutional: Denies fever, malaise, fatigue, headache or abrupt weight changes.  Respiratory: Denies difficulty breathing, shortness of breath, cough or sputum production.   Cardiovascular: Denies chest pain, chest tightness, palpitations or swelling in the hands or feet.  Skin: Pt reports rash of left elbow. Denies lesions or ulcercations.   No other specific complaints in a complete review of systems (except as listed in HPI above).  Objective:   Physical Exam BP 116/68 (BP Location: Left Arm, Patient Position: Sitting, Cuff Size: Large)   Pulse (!) 108   Temp (!) 96.4 F (35.8 C) (Temporal)   Wt 205  lb (93 kg)   SpO2 96%   BMI 33.09 kg/m   Wt Readings from Last 3 Encounters:  04/26/21 214 lb (97.1 kg)  12/07/20 206 lb 9.6 oz (93.7 kg)  10/21/20 203 lb 12.8 oz (92.4 kg)    General: Appears her stated age, obese, in NAD. Skin: Warm, dry and intact.  Patch of dry, excoriated skin noted to the left lateral elbow. Cardiovascular: Tachycardic with normal rhythm.  Pulmonary/Chest: Normal effort. Neurological: Alert and oriented.    BMET    Component Value Date/Time   NA 142 01/20/2020 0801   NA 140 06/04/2013 2350   K 4.3 01/20/2020 0801   K 3.6 06/04/2013 2350   CL 109 01/20/2020 0801   CL 104 06/04/2013 2350   CO2 24 01/20/2020 0801   CO2 30 06/04/2013 2350   GLUCOSE 93 01/20/2020 0801   GLUCOSE 92 06/04/2013 2350   BUN 13 01/20/2020 0801   BUN 9 06/04/2013 2350   CREATININE 0.74 01/20/2020 0801    CALCIUM 9.7 01/20/2020 0801   CALCIUM 8.6 06/04/2013 2350   GFRNONAA 92 01/20/2020 0801   GFRAA 106 01/20/2020 0801    Lipid Panel     Component Value Date/Time   CHOL 207 (H) 01/20/2020 0801   TRIG 70 01/20/2020 0801   HDL 68 01/20/2020 0801   CHOLHDL 3.0 01/20/2020 0801   LDLCALC 123 (H) 01/20/2020 0801    CBC    Component Value Date/Time   WBC 6.7 01/20/2020 0801   RBC 5.19 (H) 01/20/2020 0801   HGB 15.4 01/20/2020 0801   HGB 13.2 06/04/2013 2350   HCT 46.3 (H) 01/20/2020 0801   HCT 38.6 06/04/2013 2350   PLT 268 01/20/2020 0801   PLT 226 06/04/2013 2350   MCV 89.2 01/20/2020 0801   MCV 93 06/04/2013 2350   MCH 29.7 01/20/2020 0801   MCHC 33.3 01/20/2020 0801   RDW 12.9 01/20/2020 0801   RDW 13.7 06/04/2013 2350   LYMPHSABS 2,285 01/20/2020 0801   EOSABS 161 01/20/2020 0801   BASOSABS 40 01/20/2020 0801    Hgb A1C Lab Results  Component Value Date   HGBA1C 5.8 (H) 01/20/2020            Assessment & Plan:   Eczema:  She has failed multiple topical medications and creams Rx for Pred taper x9 days Avoid excessively hot showers Keep that area moisturized with Cetaphil, Eucerin or Aquaphor 2 times daily Schedule an appointment for dermatology for further evaluation  Follow-up with your PCP as previously scheduled  Nicki Reaper, NP

## 2021-08-06 ENCOUNTER — Ambulatory Visit: Payer: BC Managed Care – PPO | Admitting: Podiatry

## 2021-08-13 ENCOUNTER — Ambulatory Visit: Payer: Self-pay

## 2021-08-13 DIAGNOSIS — L2082 Flexural eczema: Secondary | ICD-10-CM

## 2021-08-13 DIAGNOSIS — L299 Pruritus, unspecified: Secondary | ICD-10-CM

## 2021-08-13 MED ORDER — PREDNISONE 20 MG PO TABS: 20.0000 mg | ORAL_TABLET | Freq: Every day | ORAL | 0 refills | Status: DC

## 2021-08-13 NOTE — Telephone Encounter (Signed)
Chief Complaint: itching on arm Symptoms: itching Frequency: 07/29/2021 Pertinent Negatives: Patient denies rash, fever Disposition: [] ED /[] Urgent Care (no appt availability in office) / [] Appointment(In office/virtual)/ []  [place] [facility]/ [] Home Care/ [] Refused Recommended Disposition /[] [facility]/ [x]  Follow-up with PCP Additional Notes: Pt was seen for rash and itching on 07/29/2021. Pt was given prednisone taper and told to keep area moisturized. Pt was to follow up with dermatology.   Rash is gone but itching remains. Pt is keeping area moisturized with no relief. No appts available with derm for months.  PT has used Clobetasol without relief. Pt has not tried antifungal or antibiotic on arm.    Pt is wanting provider to send in rx for additional prednisone or cream for rash. Pt has appt for Monday.    Summary: itching on arm   Patient states provider prescribed prednisone for a rash on her lt arm   Patient states the rash is gone but the site is still itchy   Patient inquiring if there is something else that can be prescribed   Please fu w/ patient      Reason for Disposition  [1] MODERATE-SEVERE local itching (i.e., interferes with work, school, activities) AND [2] not improved after 24 hours of hydrocortisone cream  Answer Assessment - Initial Assessment Questions 1. MAIN CONCERN OR SYMPTOM:  "What is your main concern right now?" "What question do you have?" "What's the main symptom you're worried about?" (e.g., breathing difficulty, cough, fever. pain)     Rash is gone, but itching continues 2. ONSET: "When did the  itching/ rash  start?"     07/29/2021 3. BETTER-SAME-WORSE: "Are you getting better, staying the same, or getting worse compared to how you felt at your last visit to the doctor (most recent medical visit)?"     Rash is gone itching continues 4. VISIT DATE: "When were you seen?" (Date)      5. VISIT DOCTOR: "What is the name of the doctor  taking care of you now?"      6. VISIT DIAGNOSIS:  "What was the main symptom or problem that you were seen for?" "Were you given a diagnosis?"      Eczema 7. VISIT MEDICINES: "Did the doctor order any new medicines for you to use?" If Yes, ask: "Have you filled the prescription and started taking the medicine?"       8. NEXT APPOINTMENT: "Have you scheduled a follow-up appointment with your doctor?"     Pt was to follow up with dermatology - no appts for several months 9. PAIN: "Is there any pain?" If Yes, ask: "How bad is it?"  (Scale 0-10; or mild, moderate, severe)    - NONE (0): no pain    - MILD (1-3): doesn't interfere with normal activities     - MODERATE (4-7): interferes with normal activities or awakens from sleep     - SEVERE (8-10): excruciating pain, unable to do any normal activities     0 10. FEVER: "Do you have a fever?" If Yes, ask: "What is it, how was it measured  and when did it start?"       0 11. OTHER SYMPTOMS: "Do you have any other symptoms?"       Itchy skin on arm  Answer Assessment - Initial Assessment Questions 1. DESCRIPTION: "Describe the itching you are having." "Where is it located?"     arm 2. SEVERITY: "How bad is it?"    - MILD -  doesn't interfere with normal activities   - MODERATE-SEVERE: interferes with work, school, sleep, or other activities      moderate 3. SCRATCHING: "Are there any scratch marks? Bleeding?"     no 4. ONSET: "When did the itching begin?"      6/29 5. CAUSE: "What do you think is causing the itching?"      unsure 6. OTHER SYMPTOMS: "Do you have any other symptoms?"      no 7. PREGNANCY: "Is there any chance you are pregnant?" "When was your last menstrual period?"  Protocols used: Recent Medical Visit for Illness Follow-up Call-A-AH, Itching - Localized-A-AH

## 2021-08-13 NOTE — Addendum Note (Signed)
Addended by: Smitty Cords on: 08/13/2021 02:01 PM   Modules accepted: Orders

## 2021-08-16 ENCOUNTER — Ambulatory Visit: Payer: BC Managed Care – PPO | Admitting: Family Medicine

## 2021-08-16 ENCOUNTER — Encounter: Payer: Self-pay | Admitting: Family Medicine

## 2021-08-16 VITALS — BP 143/93 | HR 103 | Ht 66.0 in | Wt 198.8 lb

## 2021-08-16 DIAGNOSIS — L2082 Flexural eczema: Secondary | ICD-10-CM

## 2021-08-16 MED ORDER — EUCRISA 2 % EX OINT: 1.0000 | TOPICAL_OINTMENT | Freq: Two times a day (BID) | CUTANEOUS | 2 refills | Status: AC | PRN

## 2021-08-16 NOTE — Patient Instructions (Addendum)
Thank you for coming to the office today.  Use the Eucrisa ointment twice a day regularly, if helpful for itch can keep using longer term  [facility] [place] Address: [address], [place], [place] [address] Phone: [phone]  Please schedule a Follow-up Appointment to: Return if symptoms worsen or fail to improve.  If you have any other questions or concerns, please feel free to call the office or send a message through MyChart. You may also schedule an earlier appointment if necessary.  Additionally, you may be receiving a survey about your experience at our office within a few days to 1 week by e-mail or mail. We value your feedback.  Saralyn Pilar, DO [facility], [place]

## 2021-08-16 NOTE — Progress Notes (Signed)
Subjective:    Patient ID: Shirley Grant, female    DOB: 1965/10/26, 56 y.o.   MRN: [id number]  Shirley Grant is a 56 y.o. female presenting on 08/16/2021 for Rash   HPI  Dermatitis / Rash / Itching 4 months ago Left upper ext flexural surface elbow with red irritated rash, itching primary symptoms. Has been episodic with flare ups for several months, initially tried various creams and topical steroid even clobetasol without relief. She was given prednisone course at last visit here 3 weeks ago, but did not resolve it. Now has had some improvement but still concerned, requesting other treatment or referral.      08/16/2021    1:28 PM 10/21/2020    9:04 AM 01/16/2020    2:34 PM  Depression screen PHQ 2/9  Decreased Interest 0 0 0  Down, Depressed, Hopeless 0 0 0  PHQ - 2 Score 0 0 0  Altered sleeping 3 2   Tired, decreased energy 0 2   Change in appetite 0 2   Feeling bad or failure about yourself  0 0   Trouble concentrating 0 0   Moving slowly or fidgety/restless 0 0   Suicidal thoughts 0 0   PHQ-9 Score 3 6   Difficult doing work/chores Somewhat difficult Not difficult at all     Social History   Tobacco Use   Smoking status: Every Day    Packs/day: 0.50    Years: 32.00    Total pack years: 16.00    Types: Cigarettes   Smokeless tobacco: Never  Vaping Use   Vaping Use: Never used  Substance Use Topics   Alcohol use: No   Drug use: Never    Review of Systems Per HPI unless specifically indicated above     Objective:    BP (!) 143/93   Pulse (!) 103   Ht 5\' 6"  (1.676 m)   Wt 198 lb 12.8 oz (90.2 kg)   SpO2 98%   BMI 32.09 kg/m   Wt Readings from Last 3 Encounters:  08/16/21 198 lb 12.8 oz (90.2 kg)  07/29/21 205 lb (93 kg)  04/26/21 214 lb (97.1 kg)    Physical Exam Vitals and nursing note reviewed.  Constitutional:      General: She is not in acute distress.    Appearance: Normal appearance. She is well-developed. She is not diaphoretic.      Comments: Well-appearing, comfortable, cooperative  HENT:     Head: Normocephalic and atraumatic.  Eyes:     General:        Right eye: No discharge.        Left eye: No discharge.     Conjunctiva/sclera: Conjunctivae normal.  Cardiovascular:     Rate and Rhythm: Normal rate.  Pulmonary:     Effort: Pulmonary effort is normal.  Skin:    General: Skin is warm and dry.     Findings: Rash (resolving rash Left flexural surface with some dry skin dermatitis residual, notable inc sun exposure) present. No erythema.  Neurological:     Mental Status: She is alert and oriented to person, place, and time.  Psychiatric:        Mood and Affect: Mood normal.        Behavior: Behavior normal.        Thought Content: Thought content normal.     Comments: Well groomed, good eye contact, normal speech and thoughts    Results for orders placed or performed  in visit on 04/26/21  Urine Culture   Specimen: Urine  Result Value Ref Range   MICRO NUMBER: [id number]    SPECIMEN QUALITY: Adequate    Sample Source URINE    STATUS: FINAL    Result: No Growth   POCT urinalysis dipstick  Result Value Ref Range   Color, UA     Clarity, UA     Glucose, UA Negative Negative   Bilirubin, UA Negative    Ketones, UA Negative    Spec Grav, UA <=1.005 (A) 1.010 - 1.025   Blood, UA Negative    pH, UA 7.0 5.0 - 8.0   Protein, UA Negative Negative   Urobilinogen, UA 0.2 0.2 or 1.0 E.U./dL   Nitrite, UA Negative    Leukocytes, UA Negative Negative   Appearance     Odor        Assessment & Plan:   Problem List Items Addressed This Visit   None Visit Diagnoses     Flexural eczema    -  Primary   Relevant Medications   EUCRISA 2 % OINT   Other Relevant Orders   Ambulatory referral to Dermatology       Uncertain exact etiology, history most suggestive of atopic dermatitis vs eczema Localized pruritic rash, appear to have dry irritated skin residual No active rash at this time No sign of  infection or other cause Not consistent with poison ivy since duration >4 months but has had episodic flares  Failed Clobetasol and other topical steroids Multiple moisturizers OTC tried  Rx Eucrisa topical ointment BID dosing for 2+ weeks or longer if successful  Referral to Dermatology submitted  Orders Placed This Encounter  Procedures   Ambulatory referral to Dermatology    Referral Priority:   Routine    Referral Type:   Consultation    Referral Reason:   Specialty Services Required    Requested Specialty:   Dermatology    Number of Visits Requested:   1     Meds ordered this encounter  Medications   EUCRISA 2 % OINT    Sig: Apply 1 Application topically 2 (two) times daily as needed.    Dispense:  60 g    Refill:  2      Follow up plan: Return if symptoms worsen or fail to improve.   Saralyn Pilar, DO [facility] 08/16/2021, 1:49 PM

## 2021-12-16 LAB — COLOGUARD: COLOGUARD: POSITIVE — AB

## 2021-12-21 ENCOUNTER — Encounter: Payer: Self-pay | Admitting: Family Medicine

## 2021-12-22 ENCOUNTER — Telehealth: Payer: Self-pay

## 2021-12-22 ENCOUNTER — Other Ambulatory Visit: Payer: Self-pay

## 2021-12-22 DIAGNOSIS — Z1211 Encounter for screening for malignant neoplasm of colon: Secondary | ICD-10-CM

## 2021-12-22 DIAGNOSIS — R195 Other fecal abnormalities: Secondary | ICD-10-CM

## 2021-12-22 MED ORDER — NA SULFATE-K SULFATE-MG SULF 17.5-3.13-1.6 GM/177ML PO SOLN: 1.0000 | Freq: Once | ORAL | 0 refills | Status: AC

## 2021-12-22 NOTE — Telephone Encounter (Signed)
Gastroenterology Pre-Procedure Review  Request Date: 01/10/22 Requesting Physician: Dr. Servando Snare  PATIENT REVIEW QUESTIONS: The patient responded to the following health history questions as indicated:    1. Are you having any GI issues? no however patient had a positive cologuard test.  This is her first colonoscopy. 2. Do you have a personal history of Polyps? no 3. Do you have a family history of Colon Cancer or Polyps? no 4. Diabetes Mellitus? no 5. Joint replacements in the past 12 months?no 6. Major health problems in the past 3 months?no 7. Any artificial heart valves, MVP, or defibrillator?no    MEDICATIONS & ALLERGIES:    Patient reports the following regarding taking any anticoagulation/antiplatelet therapy:   Plavix, Coumadin, Eliquis, Xarelto, Lovenox, Pradaxa, Brilinta, or Effient? no Aspirin? no  Patient confirms/reports the following medications:  Current Outpatient Medications  Medication Sig Dispense Refill   acetaminophen (TYLENOL) 500 MG tablet Take 1,000 mg by mouth every 6 (six) hours as needed.     diphenhydrAMINE (BENADRYL) 50 MG tablet Take 50 mg by mouth at bedtime as needed for itching.     EUCRISA 2 % OINT Apply 1 Application topically 2 (two) times daily as needed. 60 g 2   fluticasone (FLONASE) 50 MCG/ACT nasal spray Place 1 spray into both nostrils daily as needed for allergies or rhinitis.     ibuprofen (ADVIL) 200 MG tablet Take 800 mg by mouth every 6 (six) hours as needed.     meloxicam (MOBIC) 15 MG tablet Take 15 mg by mouth daily.     predniSONE (DELTASONE) 20 MG tablet Take 1 tablet (20 mg total) by mouth daily with breakfast. For up to 5 days 5 tablet 0   No current facility-administered medications for this visit.    Patient confirms/reports the following allergies:  Allergies  Allergen Reactions   Codeine     No orders of the defined types were placed in this encounter.   AUTHORIZATION INFORMATION Primary Insurance: 1D#: Group  #:  Secondary Insurance: 1D#: Group #:  SCHEDULE INFORMATION: Date: 01/10/22 Time: Location: [facility]

## 2022-01-03 ENCOUNTER — Encounter: Payer: Self-pay | Admitting: Gastroenterology

## 2022-01-03 ENCOUNTER — Other Ambulatory Visit: Payer: Self-pay | Admitting: Internal Medicine

## 2022-01-03 DIAGNOSIS — I2089 Other forms of angina pectoris: Secondary | ICD-10-CM

## 2022-01-03 DIAGNOSIS — Z8249 Family history of ischemic heart disease and other diseases of the circulatory system: Secondary | ICD-10-CM

## 2022-01-04 ENCOUNTER — Encounter: Payer: Self-pay | Admitting: Anesthesiology

## 2022-01-05 ENCOUNTER — Ambulatory Visit
Admission: RE | Admit: 2022-01-05 | Discharge: 2022-01-05 | Disposition: A | Discharge status: Home / Self Care | Payer: BC Managed Care – PPO | Source: Ambulatory Visit | Attending: Internal Medicine | Admitting: Internal Medicine

## 2022-01-05 DIAGNOSIS — I2089 Other forms of angina pectoris: Secondary | ICD-10-CM | POA: Insufficient documentation

## 2022-01-05 DIAGNOSIS — Z8249 Family history of ischemic heart disease and other diseases of the circulatory system: Secondary | ICD-10-CM | POA: Insufficient documentation

## 2022-01-10 ENCOUNTER — Ambulatory Visit
Admission: RE | Admit: 2022-01-10 | Payer: BC Managed Care – PPO | Source: Home / Self Care | Admitting: Gastroenterology

## 2022-01-10 HISTORY — DX: Unspecified osteoarthritis, unspecified site: M19.90

## 2022-01-10 SURGERY — COLONOSCOPY WITH PROPOFOL
Anesthesia: Choice

## 2022-05-26 ENCOUNTER — Other Ambulatory Visit: Payer: Self-pay | Admitting: Student

## 2022-05-26 DIAGNOSIS — G250 Essential tremor: Secondary | ICD-10-CM

## 2022-09-19 ENCOUNTER — Encounter: Payer: Self-pay | Admitting: Orthopaedic Surgery

## 2022-09-20 ENCOUNTER — Other Ambulatory Visit: Payer: Self-pay | Admitting: Orthopaedic Surgery

## 2022-09-20 DIAGNOSIS — M7542 Impingement syndrome of left shoulder: Secondary | ICD-10-CM

## 2022-09-21 ENCOUNTER — Other Ambulatory Visit: Payer: Self-pay | Admitting: Orthopaedic Surgery

## 2022-09-21 DIAGNOSIS — M7061 Trochanteric bursitis, right hip: Secondary | ICD-10-CM

## 2022-10-10 ENCOUNTER — Other Ambulatory Visit: Payer: Self-pay

## 2022-10-19 ENCOUNTER — Ambulatory Visit
Admission: RE | Admit: 2022-10-19 | Discharge: 2022-10-19 | Disposition: A | Discharge status: Home / Self Care | Payer: No Typology Code available for payment source | Source: Ambulatory Visit | Attending: Orthopaedic Surgery

## 2022-10-19 ENCOUNTER — Ambulatory Visit
Admission: RE | Admit: 2022-10-19 | Discharge: 2022-10-19 | Disposition: A | Discharge status: Home / Self Care | Payer: No Typology Code available for payment source | Source: Ambulatory Visit | Attending: Orthopaedic Surgery | Admitting: Orthopaedic Surgery

## 2022-10-19 DIAGNOSIS — M7542 Impingement syndrome of left shoulder: Secondary | ICD-10-CM

## 2022-10-19 DIAGNOSIS — M7061 Trochanteric bursitis, right hip: Secondary | ICD-10-CM

## 2023-03-03 ENCOUNTER — Other Ambulatory Visit: Payer: Self-pay | Admitting: *Deleted

## 2023-03-03 DIAGNOSIS — Z1231 Encounter for screening mammogram for malignant neoplasm of breast: Secondary | ICD-10-CM

## 2023-04-13 ENCOUNTER — Ambulatory Visit: Payer: No Typology Code available for payment source

## 2023-05-08 ENCOUNTER — Ambulatory Visit
Admission: RE | Admit: 2023-05-08 | Discharge: 2023-05-08 | Disposition: A | Discharge status: Home / Self Care | Payer: PRIVATE HEALTH INSURANCE | Source: Ambulatory Visit | Attending: *Deleted | Admitting: *Deleted

## 2023-05-08 DIAGNOSIS — Z1231 Encounter for screening mammogram for malignant neoplasm of breast: Secondary | ICD-10-CM

## 2023-11-10 ENCOUNTER — Other Ambulatory Visit: Payer: Self-pay | Admitting: Orthopaedic Surgery

## 2023-11-10 DIAGNOSIS — M75112 Incomplete rotator cuff tear or rupture of left shoulder, not specified as traumatic: Secondary | ICD-10-CM

## 2023-11-17 ENCOUNTER — Other Ambulatory Visit

## 2023-11-21 ENCOUNTER — Ambulatory Visit
Admission: RE | Admit: 2023-11-21 | Discharge: 2023-11-21 | Disposition: A | Discharge status: Home / Self Care | Source: Ambulatory Visit | Attending: Orthopaedic Surgery | Admitting: Orthopaedic Surgery

## 2023-11-21 DIAGNOSIS — M75112 Incomplete rotator cuff tear or rupture of left shoulder, not specified as traumatic: Secondary | ICD-10-CM

## 2023-11-22 ENCOUNTER — Other Ambulatory Visit
